# Patient Record
Sex: Male | Born: 1935 | Race: Black or African American | Marital: Married | Smoking: Current every day smoker
Health system: Southern US, Community
[De-identification: ages and names within clinical notes are randomized; demographics above are authoritative.]

## PROBLEM LIST (undated history)

## (undated) DIAGNOSIS — C50919 Malignant neoplasm of unspecified site of unspecified female breast: Secondary | ICD-10-CM

## (undated) DIAGNOSIS — R279 Unspecified lack of coordination: Secondary | ICD-10-CM

## (undated) DIAGNOSIS — M109 Gout, unspecified: Secondary | ICD-10-CM

## (undated) DIAGNOSIS — I639 Cerebral infarction, unspecified: Secondary | ICD-10-CM

## (undated) DIAGNOSIS — R531 Weakness: Secondary | ICD-10-CM

## (undated) DIAGNOSIS — E079 Disorder of thyroid, unspecified: Secondary | ICD-10-CM

## (undated) DIAGNOSIS — I10 Essential (primary) hypertension: Secondary | ICD-10-CM

## (undated) DIAGNOSIS — R5383 Other fatigue: Secondary | ICD-10-CM

## (undated) DIAGNOSIS — H269 Unspecified cataract: Secondary | ICD-10-CM

## (undated) DIAGNOSIS — C61 Malignant neoplasm of prostate: Secondary | ICD-10-CM

## (undated) DIAGNOSIS — M4800 Spinal stenosis, site unspecified: Secondary | ICD-10-CM

## (undated) HISTORY — PX: BREAST LUMPECTOMY: SHX2

## (undated) HISTORY — PX: BACK SURGERY: SHX140

## (undated) HISTORY — PX: X-STOP IMPLANTATION: SHX2677

---

## 2012-03-21 ENCOUNTER — Other Ambulatory Visit (HOSPITAL_COMMUNITY): Payer: Self-pay | Admitting: Internal Medicine

## 2012-03-21 DIAGNOSIS — R4182 Altered mental status, unspecified: Secondary | ICD-10-CM

## 2012-03-22 ENCOUNTER — Emergency Department (HOSPITAL_COMMUNITY)
Admission: EM | Admit: 2012-03-22 | Discharge: 2012-03-22 | Disposition: A | Discharge status: Home / Self Care | Payer: Medicare Other | Attending: Emergency Medicine | Admitting: Emergency Medicine

## 2012-03-22 ENCOUNTER — Other Ambulatory Visit: Payer: Self-pay

## 2012-03-22 ENCOUNTER — Encounter (HOSPITAL_COMMUNITY): Payer: Self-pay

## 2012-03-22 ENCOUNTER — Emergency Department (HOSPITAL_COMMUNITY): Payer: Medicare Other

## 2012-03-22 ENCOUNTER — Ambulatory Visit (HOSPITAL_COMMUNITY)
Admission: RE | Admit: 2012-03-22 | Discharge: 2012-03-22 | Disposition: A | Discharge status: Home / Self Care | Payer: Medicare Other | Source: Ambulatory Visit | Attending: Internal Medicine | Admitting: Internal Medicine

## 2012-03-22 DIAGNOSIS — R4182 Altered mental status, unspecified: Secondary | ICD-10-CM

## 2012-03-22 DIAGNOSIS — W19XXXA Unspecified fall, initial encounter: Secondary | ICD-10-CM

## 2012-03-22 DIAGNOSIS — G319 Degenerative disease of nervous system, unspecified: Secondary | ICD-10-CM | POA: Insufficient documentation

## 2012-03-22 DIAGNOSIS — R55 Syncope and collapse: Secondary | ICD-10-CM

## 2012-03-22 DIAGNOSIS — F29 Unspecified psychosis not due to a substance or known physiological condition: Secondary | ICD-10-CM | POA: Insufficient documentation

## 2012-03-22 DIAGNOSIS — Z79899 Other long term (current) drug therapy: Secondary | ICD-10-CM | POA: Insufficient documentation

## 2012-03-22 DIAGNOSIS — I6789 Other cerebrovascular disease: Secondary | ICD-10-CM | POA: Insufficient documentation

## 2012-03-22 DIAGNOSIS — E079 Disorder of thyroid, unspecified: Secondary | ICD-10-CM | POA: Insufficient documentation

## 2012-03-22 DIAGNOSIS — E119 Type 2 diabetes mellitus without complications: Secondary | ICD-10-CM | POA: Insufficient documentation

## 2012-03-22 DIAGNOSIS — R413 Other amnesia: Secondary | ICD-10-CM | POA: Insufficient documentation

## 2012-03-22 DIAGNOSIS — I10 Essential (primary) hypertension: Secondary | ICD-10-CM | POA: Insufficient documentation

## 2012-03-22 DIAGNOSIS — R5381 Other malaise: Secondary | ICD-10-CM | POA: Insufficient documentation

## 2012-03-22 HISTORY — DX: Unspecified lack of coordination: R27.9

## 2012-03-22 HISTORY — DX: Weakness: R53.1

## 2012-03-22 HISTORY — DX: Spinal stenosis, site unspecified: M48.00

## 2012-03-22 HISTORY — DX: Disorder of thyroid, unspecified: E07.9

## 2012-03-22 HISTORY — DX: Other fatigue: R53.83

## 2012-03-22 HISTORY — DX: Essential (primary) hypertension: I10

## 2012-03-22 HISTORY — DX: Unspecified cataract: H26.9

## 2012-03-22 LAB — COMPREHENSIVE METABOLIC PANEL
ALT: 13 U/L (ref 0–53)
AST: 21 U/L (ref 0–37)
Albumin: 3.6 g/dL (ref 3.5–5.2)
Alkaline Phosphatase: 45 U/L (ref 39–117)
BUN: 15 mg/dL (ref 6–23)
CO2: 30 mEq/L (ref 19–32)
Calcium: 10.7 mg/dL — ABNORMAL HIGH (ref 8.4–10.5)
Chloride: 96 mEq/L (ref 96–112)
Creatinine, Ser: 0.96 mg/dL (ref 0.50–1.35)
GFR calc Af Amer: 88 mL/min — ABNORMAL LOW (ref >90)
GFR calc non Af Amer: 76 mL/min — ABNORMAL LOW (ref >90)
Glucose, Bld: 145 mg/dL — ABNORMAL HIGH (ref 70–99)
Potassium: 3.5 mEq/L (ref 3.5–5.1)
Sodium: 138 mEq/L (ref 135–145)
Total Bilirubin: 0.3 mg/dL (ref 0.3–1.2)
Total Protein: 6.8 g/dL (ref 6.0–8.3)

## 2012-03-22 LAB — CBC WITH DIFFERENTIAL/PLATELET
Basophils Absolute: 0 10*3/uL (ref 0.0–0.1)
Basophils Relative: 0 % (ref 0–1)
Eosinophils Absolute: 0.3 10*3/uL (ref 0.0–0.7)
Eosinophils Relative: 3 % (ref 0–5)
HCT: 32.8 % — ABNORMAL LOW (ref 39.0–52.0)
Hemoglobin: 11.5 g/dL — ABNORMAL LOW (ref 13.0–17.0)
Lymphocytes Relative: 25 % (ref 12–46)
Lymphs Abs: 2.2 10*3/uL (ref 0.7–4.0)
MCH: 32.6 pg (ref 26.0–34.0)
MCHC: 35.1 g/dL (ref 30.0–36.0)
MCV: 92.9 fL (ref 78.0–100.0)
Monocytes Absolute: 0.8 10*3/uL (ref 0.1–1.0)
Monocytes Relative: 9 % (ref 3–12)
Neutro Abs: 5.2 10*3/uL (ref 1.7–7.7)
Neutrophils Relative %: 62 % (ref 43–77)
Platelets: 291 10*3/uL (ref 150–400)
RBC: 3.53 MIL/uL — ABNORMAL LOW (ref 4.22–5.81)
RDW: 15.5 % (ref 11.5–15.5)
WBC: 8.5 10*3/uL (ref 4.0–10.5)

## 2012-03-22 LAB — URINALYSIS, ROUTINE W REFLEX MICROSCOPIC
Bilirubin Urine: NEGATIVE
Glucose, UA: NEGATIVE mg/dL
Hgb urine dipstick: NEGATIVE
Ketones, ur: NEGATIVE mg/dL
Leukocytes, UA: NEGATIVE
Nitrite: NEGATIVE
Protein, ur: NEGATIVE mg/dL
Specific Gravity, Urine: 1.022 (ref 1.005–1.030)
Urobilinogen, UA: 0.2 mg/dL (ref 0.0–1.0)
pH: 5.5 (ref 5.0–8.0)

## 2012-03-22 LAB — POCT I-STAT TROPONIN I: Troponin i, poc: 0.01 ng/mL (ref 0.00–0.08)

## 2012-03-22 MED ORDER — SODIUM CHLORIDE 0.9 % IV BOLUS (SEPSIS)
500.0000 mL | INTRAVENOUS | Status: AC
  Administered 2012-03-22: 1000 mL via INTRAVENOUS

## 2012-03-22 NOTE — ED Notes (Addendum)
Per niece at bedside, pt ambulatory w/walker which is pt's norm, pt reported to niece he didn't feel good, he felt weak and pt grabbed his chest. Niece reports pt didn't "look good" and his speech was slurred. Niece reports pt fell onto his knees then backwards landing on his bottom, niece is unsure if pt hit his head but states pt was lying on his back and head was on the pavement. Niece denies LOC or N/V. Niece reports pt was a/o per his norm. Niece reports pt was here earlier for IR evaluation s/p skeletal surgery, pt is currently a resident at a NH for rehabilitation. Niece reports the front of the back was cobblestone walkway. After leaving the IR evaluation, pt and niece "ran several errands;" went inside the bank, [employer] shack, and [employer].   Pt reports he just does'nt feel like a normal person, pt unable to specify. Pt denies SOB, chest/back/abd pain, N/V/D.

## 2012-03-22 NOTE — ED Notes (Signed)
Family at bedside. 

## 2012-03-22 NOTE — ED Notes (Signed)
The pt was just released from here today after he had a c-t scan here in the ed.  He was going to the atm when his legs gave way and his family member called the ems.  He arrived bu gem alert no pain anywhere .  He reports he was feeling weak and his legs gave way while walking he did not fall.  He has a history of the same.  No pain no other complaints.  Sleeping in  The ems on the way here .

## 2012-03-22 NOTE — ED Provider Notes (Signed)
History     CSN: [id number]  Arrival date & time 03/22/12  1610   First MD Initiated Contact with Patient 03/22/12 1702      Chief Complaint  Patient presents with  . Fatigue    (Consider location/radiation/quality/duration/timing/severity/associated sxs/prior treatment) Patient is a 78 y.o. male presenting with fall. The history is provided by the patient and a relative.  Fall The accident occurred less than 1 hour ago. The fall occurred while walking. Distance fallen: from standing. He landed on concrete. There was no blood loss. Point of impact: knees, then back/head. The pain is at a severity of 0/10. The patient is experiencing no pain. He was not ambulatory at the scene. Pertinent negatives include no fever, no numbness, no abdominal pain, no nausea, no vomiting, no hematuria and no headaches. The symptoms are aggravated by activity. He has tried nothing for the symptoms. The treatment provided significant relief.    Past Medical History  Diagnosis Date  . Hypertension   . Spinal stenosis   . Thyroid disease   . Diabetes mellitus   . Cataract   . Weakness   . Fatigue   . Lack of coordination     Past Surgical History  Procedure Date  . X-stop implantation     Family History  Problem Relation Age of Onset  . Family history unknown: Yes    History  Substance Use Topics  . Smoking status: Never Smoker   . Smokeless tobacco: Not on file  . Alcohol Use: No      Review of Systems  Constitutional: Negative for fever.  HENT: Negative for rhinorrhea, drooling and neck pain.   Eyes: Negative for pain.  Respiratory: Negative for cough and shortness of breath.   Cardiovascular: Negative for chest pain and leg swelling.  Gastrointestinal: Negative for nausea, vomiting, abdominal pain and diarrhea.  Genitourinary: Negative for dysuria and hematuria.  Musculoskeletal: Negative for gait problem.  Skin: Negative for color change.  Neurological: Negative for numbness  and headaches.  Hematological: Negative for adenopathy.  Psychiatric/Behavioral: Negative for behavioral problems.  All other systems reviewed and are negative.    Allergies  Review of patient's allergies indicates no known allergies.  Home Medications   Current Outpatient Rx  Name Route Sig Dispense Refill  . CALCITRIOL 0.25 MCG PO CAPS Oral Take 0.25 mcg by mouth 2 (two) times daily.    Marland Kitchen CALCIUM CARBONATE 600 MG PO TABS Oral Take 1,200 mg by mouth daily.    Marland Kitchen FLUTICASONE PROPIONATE 50 MCG/ACT NA SUSP Nasal Place 2 sprays into the nose daily.    Marland Kitchen HYDROCHLOROTHIAZIDE 25 MG PO TABS Oral Take 25 mg by mouth daily.    Marland Kitchen KETOCONAZOLE 2 % EX SHAM Topical Apply 1 application topically 2 (two) times a week.    Marland Kitchen LATANOPROST 0.005 % OP SOLN Both Eyes Place 1 drop into both eyes at bedtime.    Marland Kitchen LEVOTHYROXINE SODIUM 137 MCG PO TABS Oral Take 137 mcg by mouth daily.    Marland Kitchen MAGNESIUM CHLORIDE 64 MG PO TBEC Oral Take 1 tablet by mouth 2 (two) times daily.    . MELOXICAM 7.5 MG PO TABS Oral Take 7.5 mg by mouth 2 (two) times daily.    Marland Kitchen METFORMIN HCL 500 MG PO TABS Oral Take 500 mg by mouth 2 (two) times daily with a meal.    . ADULT MULTIVITAMIN W/MINERALS CH Oral Take 1 tablet by mouth daily.    . OXYCODONE HCL 5 MG PO  TABS Oral Take 5-10 mg by mouth every 3 (three) hours as needed. For pain    . POTASSIUM CHLORIDE ER 10 MEQ PO CPCR Oral Take 10 mEq by mouth 2 (two) times daily.    Marland Kitchen TAMSULOSIN HCL 0.4 MG PO CAPS Oral Take 0.4 mg by mouth daily.    Marland Kitchen VITAMIN C 500 MG PO TABS Oral Take 500 mg by mouth 2 (two) times daily.      BP 138/62  Pulse 48  Temp 98 F (36.7 C) (Oral)  Resp 19  SpO2 99%  Physical Exam  Nursing note and vitals reviewed. Constitutional: He is oriented to person, place, and time. He appears well-developed and well-nourished.  HENT:  Head: Normocephalic and atraumatic.  Right Ear: External ear normal.  Left Ear: External ear normal.  Nose: Nose normal.    Mouth/Throat: Oropharynx is clear and moist. No oropharyngeal exudate.  Eyes: Conjunctivae and EOM are normal. Pupils are equal, round, and reactive to light.  Neck: Normal range of motion. Neck supple.  Cardiovascular: Normal rate, regular rhythm, normal heart sounds and intact distal pulses.  Exam reveals no gallop and no friction rub.   No murmur heard. Pulmonary/Chest: Effort normal and breath sounds normal. No respiratory distress. He has no wheezes.  Abdominal: Soft. Bowel sounds are normal. He exhibits no distension. There is no tenderness. There is no rebound and no guarding.  Musculoskeletal: Normal range of motion. He exhibits no edema and no tenderness.  Neurological: He is alert and oriented to person, place, and time. He has normal strength. No cranial nerve deficit or sensory deficit. Coordination normal.  Skin: Skin is warm and dry.  Psychiatric: He has a normal mood and affect. His behavior is normal.    ED Course  Procedures (including critical care time)   Labs Reviewed  CBC WITH DIFFERENTIAL  COMPREHENSIVE METABOLIC PANEL  URINALYSIS, ROUTINE W REFLEX MICROSCOPIC  URINE CULTURE   Ct Head Wo Contrast  03/22/2012  *RADIOLOGY REPORT*  Clinical Data: Confusion with weakness and memory loss.  Altered mental status.  CT HEAD WITHOUT CONTRAST  Technique:  Contiguous axial images were obtained from the base of the skull through the vertex without contrast.  Comparison: None.  Findings: Age appropriate atrophy.  Chronic microvascular ischemic changes in the white matter.  No acute infarct.  Negative for hemorrhage or mass.  Calvarium intact.  The visualized paranasal sinuses are clear.  IMPRESSION: Atrophy and chronic microvascular ischemia.  No acute abnormality.   Original Report Authenticated By: Camelia Phenes, M.D.      No diagnosis found.    MDM  5:57 PM 78 y.o. male w hx of dm, spinal stenosis s/p recent back surgery currently in rehab pw fall from standing. Pt seen  here earlier today for outpt CT of head which was non-contrib. Pt states he was not feeling right when walking out of the bank w/ his walker and fell to his knees and back hitting his head. He denies loc, appears well on exam, no motor/sensory abnormalities. He denies syncope, suspect possible presyncopal event. Do not think repeat ct head warranted, will get screening labs.    Screening labs/imaging non-contrib. Pt continues to appear well on exam. Ambulated to bathroom w/ walker. He would like to go home. I have discussed the diagnosis/risks/treatment options with the patient and family and believe the pt to be eligible for discharge home to follow-up with pcp as needed. We also discussed returning to the ED immediately if new  or worsening sx occur. We discussed the sx which are most concerning (e.g., further falls) that necessitate immediate return. Any new prescriptions provided to the patient are listed below.  New Prescriptions   No medications on file    Clinical Impression 1. Fall   2. Pre-syncope        Purvis Sheffield, MD 03/23/12 [phone]

## 2012-03-22 NOTE — ED Notes (Signed)
MD at bedside. (Dr. Harrison) 

## 2012-03-23 LAB — URINE CULTURE
Colony Count: NO GROWTH
Culture: NO GROWTH

## 2012-03-28 NOTE — ED Provider Notes (Signed)
I saw and evaluated the patient, reviewed the resident's note and I agree with the findings and plan.  78 year old male with presyncope. On exam no acute distress. Well-appearing. Heart regular without murmur. Lungs are clear with good air movement. No increased work of breathing. Abdomen is nontender nondistended. Extremities equally. Workup fairly unremarkable. Patient was to be discharged. Feel that this is reasonable at this time. Return outpatient followup  Raeford Razor, MD 03/28/12 0425

## 2014-03-07 ENCOUNTER — Emergency Department (HOSPITAL_COMMUNITY): Payer: Medicare HMO

## 2014-03-07 ENCOUNTER — Inpatient Hospital Stay (HOSPITAL_COMMUNITY)
Admission: EM | Admit: 2014-03-07 | Discharge: 2014-03-17 | DRG: 871 | Disposition: E | Payer: Medicare HMO | Attending: Internal Medicine | Admitting: Internal Medicine

## 2014-03-07 ENCOUNTER — Encounter (HOSPITAL_COMMUNITY): Payer: Self-pay | Admitting: Emergency Medicine

## 2014-03-07 DIAGNOSIS — K120 Recurrent oral aphthae: Secondary | ICD-10-CM | POA: Diagnosis present

## 2014-03-07 DIAGNOSIS — C50919 Malignant neoplasm of unspecified site of unspecified female breast: Secondary | ICD-10-CM | POA: Diagnosis present

## 2014-03-07 DIAGNOSIS — J189 Pneumonia, unspecified organism: Secondary | ICD-10-CM | POA: Diagnosis present

## 2014-03-07 DIAGNOSIS — M109 Gout, unspecified: Secondary | ICD-10-CM | POA: Diagnosis present

## 2014-03-07 DIAGNOSIS — I10 Essential (primary) hypertension: Secondary | ICD-10-CM | POA: Diagnosis present

## 2014-03-07 DIAGNOSIS — E039 Hypothyroidism, unspecified: Secondary | ICD-10-CM | POA: Diagnosis present

## 2014-03-07 DIAGNOSIS — I314 Cardiac tamponade: Secondary | ICD-10-CM | POA: Diagnosis present

## 2014-03-07 DIAGNOSIS — Z853 Personal history of malignant neoplasm of breast: Secondary | ICD-10-CM

## 2014-03-07 DIAGNOSIS — N178 Other acute kidney failure: Secondary | ICD-10-CM

## 2014-03-07 DIAGNOSIS — J852 Abscess of lung without pneumonia: Secondary | ICD-10-CM | POA: Diagnosis present

## 2014-03-07 DIAGNOSIS — I313 Pericardial effusion (noninflammatory): Secondary | ICD-10-CM

## 2014-03-07 DIAGNOSIS — R7989 Other specified abnormal findings of blood chemistry: Secondary | ICD-10-CM

## 2014-03-07 DIAGNOSIS — Z66 Do not resuscitate: Secondary | ICD-10-CM | POA: Diagnosis not present

## 2014-03-07 DIAGNOSIS — M4800 Spinal stenosis, site unspecified: Secondary | ICD-10-CM | POA: Diagnosis present

## 2014-03-07 DIAGNOSIS — E860 Dehydration: Secondary | ICD-10-CM | POA: Diagnosis present

## 2014-03-07 DIAGNOSIS — T68XXXA Hypothermia, initial encounter: Secondary | ICD-10-CM | POA: Diagnosis present

## 2014-03-07 DIAGNOSIS — R0609 Other forms of dyspnea: Secondary | ICD-10-CM

## 2014-03-07 DIAGNOSIS — Z515 Encounter for palliative care: Secondary | ICD-10-CM | POA: Diagnosis not present

## 2014-03-07 DIAGNOSIS — F17200 Nicotine dependence, unspecified, uncomplicated: Secondary | ICD-10-CM | POA: Diagnosis present

## 2014-03-07 DIAGNOSIS — R6510 Systemic inflammatory response syndrome (SIRS) of non-infectious origin without acute organ dysfunction: Secondary | ICD-10-CM

## 2014-03-07 DIAGNOSIS — C3410 Malignant neoplasm of upper lobe, unspecified bronchus or lung: Secondary | ICD-10-CM | POA: Diagnosis present

## 2014-03-07 DIAGNOSIS — I4891 Unspecified atrial fibrillation: Secondary | ICD-10-CM | POA: Diagnosis present

## 2014-03-07 DIAGNOSIS — Z8546 Personal history of malignant neoplasm of prostate: Secondary | ICD-10-CM

## 2014-03-07 DIAGNOSIS — C380 Malignant neoplasm of heart: Secondary | ICD-10-CM

## 2014-03-07 DIAGNOSIS — A419 Sepsis, unspecified organism: Secondary | ICD-10-CM | POA: Diagnosis present

## 2014-03-07 DIAGNOSIS — R6521 Severe sepsis with septic shock: Secondary | ICD-10-CM

## 2014-03-07 DIAGNOSIS — K7200 Acute and subacute hepatic failure without coma: Secondary | ICD-10-CM | POA: Diagnosis present

## 2014-03-07 DIAGNOSIS — J910 Malignant pleural effusion: Secondary | ICD-10-CM

## 2014-03-07 DIAGNOSIS — R6520 Severe sepsis without septic shock: Secondary | ICD-10-CM | POA: Diagnosis present

## 2014-03-07 DIAGNOSIS — N400 Enlarged prostate without lower urinary tract symptoms: Secondary | ICD-10-CM | POA: Diagnosis present

## 2014-03-07 DIAGNOSIS — I3139 Other pericardial effusion (noninflammatory): Secondary | ICD-10-CM | POA: Diagnosis present

## 2014-03-07 DIAGNOSIS — C779 Secondary and unspecified malignant neoplasm of lymph node, unspecified: Secondary | ICD-10-CM

## 2014-03-07 DIAGNOSIS — R627 Adult failure to thrive: Secondary | ICD-10-CM | POA: Diagnosis present

## 2014-03-07 DIAGNOSIS — Z8673 Personal history of transient ischemic attack (TIA), and cerebral infarction without residual deficits: Secondary | ICD-10-CM | POA: Diagnosis not present

## 2014-03-07 DIAGNOSIS — R634 Abnormal weight loss: Secondary | ICD-10-CM | POA: Diagnosis present

## 2014-03-07 DIAGNOSIS — IMO0002 Reserved for concepts with insufficient information to code with codable children: Secondary | ICD-10-CM

## 2014-03-07 DIAGNOSIS — J9600 Acute respiratory failure, unspecified whether with hypoxia or hypercapnia: Secondary | ICD-10-CM | POA: Diagnosis present

## 2014-03-07 DIAGNOSIS — N179 Acute kidney failure, unspecified: Secondary | ICD-10-CM | POA: Diagnosis present

## 2014-03-07 DIAGNOSIS — F1010 Alcohol abuse, uncomplicated: Secondary | ICD-10-CM | POA: Diagnosis present

## 2014-03-07 DIAGNOSIS — N19 Unspecified kidney failure: Secondary | ICD-10-CM

## 2014-03-07 DIAGNOSIS — R570 Cardiogenic shock: Secondary | ICD-10-CM | POA: Diagnosis present

## 2014-03-07 DIAGNOSIS — E119 Type 2 diabetes mellitus without complications: Secondary | ICD-10-CM | POA: Diagnosis present

## 2014-03-07 DIAGNOSIS — C7970 Secondary malignant neoplasm of unspecified adrenal gland: Secondary | ICD-10-CM | POA: Diagnosis present

## 2014-03-07 DIAGNOSIS — R0602 Shortness of breath: Secondary | ICD-10-CM | POA: Diagnosis present

## 2014-03-07 DIAGNOSIS — I318 Other specified diseases of pericardium: Secondary | ICD-10-CM | POA: Diagnosis present

## 2014-03-07 DIAGNOSIS — E43 Unspecified severe protein-calorie malnutrition: Secondary | ICD-10-CM | POA: Diagnosis present

## 2014-03-07 DIAGNOSIS — I5189 Other ill-defined heart diseases: Secondary | ICD-10-CM

## 2014-03-07 DIAGNOSIS — R0989 Other specified symptoms and signs involving the circulatory and respiratory systems: Secondary | ICD-10-CM

## 2014-03-07 HISTORY — DX: Malignant neoplasm of unspecified site of unspecified female breast: C50.919

## 2014-03-07 HISTORY — DX: Malignant neoplasm of prostate: C61

## 2014-03-07 HISTORY — DX: Cerebral infarction, unspecified: I63.9

## 2014-03-07 HISTORY — DX: Gout, unspecified: M10.9

## 2014-03-07 LAB — CBC
HEMATOCRIT: 33.9 % — AB (ref 39.0–52.0)
Hemoglobin: 11.4 g/dL — ABNORMAL LOW (ref 13.0–17.0)
MCH: 29.7 pg (ref 26.0–34.0)
MCHC: 33.6 g/dL (ref 30.0–36.0)
MCV: 88.3 fL (ref 78.0–100.0)
PLATELETS: 477 10*3/uL — AB (ref 150–400)
RBC: 3.84 MIL/uL — AB (ref 4.22–5.81)
RDW: 16.1 % — ABNORMAL HIGH (ref 11.5–15.5)
WBC: 16.2 10*3/uL — AB (ref 4.0–10.5)

## 2014-03-07 LAB — BASIC METABOLIC PANEL
ANION GAP: 26 — AB (ref 5–15)
BUN: 77 mg/dL — AB (ref 6–23)
CHLORIDE: 100 meq/L (ref 96–112)
CO2: 17 meq/L — AB (ref 19–32)
CREATININE: 1.88 mg/dL — AB (ref 0.50–1.35)
Calcium: 6.8 mg/dL — ABNORMAL LOW (ref 8.4–10.5)
GFR calc non Af Amer: 32 mL/min — ABNORMAL LOW (ref >90)
GFR, EST AFRICAN AMERICAN: 37 mL/min — AB (ref >90)
Glucose, Bld: 193 mg/dL — ABNORMAL HIGH (ref 70–99)
POTASSIUM: 5.1 meq/L (ref 3.7–5.3)
Sodium: 143 mEq/L (ref 137–147)

## 2014-03-07 LAB — HEPATIC FUNCTION PANEL
ALBUMIN: 2.5 g/dL — AB (ref 3.5–5.2)
ALT: 528 U/L — ABNORMAL HIGH (ref 0–53)
AST: 617 U/L — ABNORMAL HIGH (ref 0–37)
Alkaline Phosphatase: 250 U/L — ABNORMAL HIGH (ref 39–117)
Bilirubin, Direct: 0.7 mg/dL — ABNORMAL HIGH (ref 0.0–0.3)
Indirect Bilirubin: 0.6 mg/dL (ref 0.3–0.9)
TOTAL PROTEIN: 6.2 g/dL (ref 6.0–8.3)
Total Bilirubin: 1.3 mg/dL — ABNORMAL HIGH (ref 0.3–1.2)

## 2014-03-07 LAB — BLOOD GAS, ARTERIAL
Acid-base deficit: 8.4 mmol/L — ABNORMAL HIGH (ref 0.0–2.0)
Bicarbonate: 15.9 mEq/L — ABNORMAL LOW (ref 20.0–24.0)
DRAWN BY: 41977
O2 CONTENT: 2 L/min
O2 Saturation: 85.9 %
PATIENT TEMPERATURE: 98.6
TCO2: 16.7 mmol/L (ref 0–100)
pCO2 arterial: 28.2 mmHg — ABNORMAL LOW (ref 35.0–45.0)
pH, Arterial: 7.37 (ref 7.350–7.450)
pO2, Arterial: 61.6 mmHg — ABNORMAL LOW (ref 80.0–100.0)

## 2014-03-07 LAB — I-STAT TROPONIN, ED: TROPONIN I, POC: 0.13 ng/mL — AB (ref 0.00–0.08)

## 2014-03-07 LAB — TROPONIN I: Troponin I: <0.3 ng/mL (ref <0.30)

## 2014-03-07 LAB — PRO B NATRIURETIC PEPTIDE: Pro B Natriuretic peptide (BNP): 2829 pg/mL — ABNORMAL HIGH (ref 0–450)

## 2014-03-07 LAB — CBG MONITORING, ED: Glucose-Capillary: 128 mg/dL — ABNORMAL HIGH (ref 70–99)

## 2014-03-07 LAB — TSH: TSH: 64.35 u[IU]/mL — ABNORMAL HIGH (ref 0.350–4.500)

## 2014-03-07 LAB — HIV ANTIBODY (ROUTINE TESTING W REFLEX): HIV: NONREACTIVE

## 2014-03-07 MED ORDER — SODIUM CHLORIDE 0.9 % IV SOLN
INTRAVENOUS | Status: DC
  Administered 2014-03-07 – 2014-03-09 (×4): via INTRAVENOUS

## 2014-03-07 MED ORDER — THIAMINE HCL 100 MG/ML IJ SOLN
100.0000 mg | Freq: Every day | INTRAMUSCULAR | Status: DC
  Filled 2014-03-07 (×3): qty 1

## 2014-03-07 MED ORDER — LATANOPROST 0.005 % OP SOLN
1.0000 [drp] | Freq: Every day | OPHTHALMIC | Status: DC
  Administered 2014-03-07 – 2014-03-09 (×3): 1 [drp] via OPHTHALMIC
  Filled 2014-03-07: qty 2.5

## 2014-03-07 MED ORDER — ALBUTEROL SULFATE (2.5 MG/3ML) 0.083% IN NEBU: 2.5000 mg | INHALATION_SOLUTION | RESPIRATORY_TRACT | Status: DC | PRN

## 2014-03-07 MED ORDER — DEXTROSE 5 % IV SOLN
1.0000 g | Freq: Once | INTRAVENOUS | Status: AC
  Administered 2014-03-07: 1 g via INTRAVENOUS
  Filled 2014-03-07: qty 10

## 2014-03-07 MED ORDER — IPRATROPIUM BROMIDE 0.02 % IN SOLN
0.5000 mg | Freq: Four times a day (QID) | RESPIRATORY_TRACT | Status: DC
  Administered 2014-03-07: 0.5 mg via RESPIRATORY_TRACT
  Filled 2014-03-07: qty 2.5

## 2014-03-07 MED ORDER — VITAMIN B-1 100 MG PO TABS
100.0000 mg | ORAL_TABLET | Freq: Every day | ORAL | Status: DC
  Administered 2014-03-07 – 2014-03-09 (×3): 100 mg via ORAL
  Filled 2014-03-07 (×4): qty 1

## 2014-03-07 MED ORDER — LEVOTHYROXINE SODIUM 137 MCG PO TABS
137.0000 ug | ORAL_TABLET | Freq: Every day | ORAL | Status: DC
  Administered 2014-03-08 – 2014-03-09 (×2): 137 ug via ORAL
  Filled 2014-03-07 (×3): qty 1

## 2014-03-07 MED ORDER — OXYCODONE HCL 5 MG PO TABS
5.0000 mg | ORAL_TABLET | ORAL | Status: DC | PRN
  Filled 2014-03-07 (×8): qty 1

## 2014-03-07 MED ORDER — ASPIRIN EC 81 MG PO TBEC
81.0000 mg | DELAYED_RELEASE_TABLET | Freq: Every day | ORAL | Status: DC
  Administered 2014-03-07 – 2014-03-09 (×3): 81 mg via ORAL
  Filled 2014-03-07 (×4): qty 1

## 2014-03-07 MED ORDER — LORAZEPAM 2 MG/ML IJ SOLN
1.0000 mg | Freq: Four times a day (QID) | INTRAMUSCULAR | Status: DC | PRN
Start: 2014-03-07 — End: 2014-03-10
  Administered 2014-03-09: 1 mg via INTRAVENOUS
  Filled 2014-03-07: qty 1

## 2014-03-07 MED ORDER — HEPARIN SODIUM (PORCINE) 5000 UNIT/ML IJ SOLN
5000.0000 [IU] | Freq: Three times a day (TID) | INTRAMUSCULAR | Status: DC
  Administered 2014-03-07 – 2014-03-10 (×7): 5000 [IU] via SUBCUTANEOUS
  Filled 2014-03-07 (×10): qty 1

## 2014-03-07 MED ORDER — AZITHROMYCIN 250 MG PO TABS
500.0000 mg | ORAL_TABLET | Freq: Once | ORAL | Status: AC
  Administered 2014-03-07: 500 mg via ORAL
  Filled 2014-03-07: qty 2

## 2014-03-07 MED ORDER — ADULT MULTIVITAMIN W/MINERALS CH
1.0000 | ORAL_TABLET | Freq: Every day | ORAL | Status: DC
  Administered 2014-03-07 – 2014-03-09 (×3): 1 via ORAL
  Filled 2014-03-07 (×4): qty 1

## 2014-03-07 MED ORDER — ALBUTEROL SULFATE (2.5 MG/3ML) 0.083% IN NEBU
2.5000 mg | INHALATION_SOLUTION | Freq: Four times a day (QID) | RESPIRATORY_TRACT | Status: DC
  Administered 2014-03-07: 2.5 mg via RESPIRATORY_TRACT
  Filled 2014-03-07: qty 3

## 2014-03-07 MED ORDER — FOLIC ACID 1 MG PO TABS
1.0000 mg | ORAL_TABLET | Freq: Every day | ORAL | Status: DC
  Administered 2014-03-07 – 2014-03-09 (×3): 1 mg via ORAL
  Filled 2014-03-07 (×3): qty 1

## 2014-03-07 MED ORDER — SODIUM CHLORIDE 0.9 % IJ SOLN
3.0000 mL | Freq: Two times a day (BID) | INTRAMUSCULAR | Status: DC
  Administered 2014-03-07 – 2014-03-09 (×4): 3 mL via INTRAVENOUS

## 2014-03-07 MED ORDER — IPRATROPIUM BROMIDE 0.02 % IN SOLN: 0.5000 mg | RESPIRATORY_TRACT | Status: DC | PRN

## 2014-03-07 MED ORDER — LORAZEPAM 1 MG PO TABS: 1.0000 mg | ORAL_TABLET | Freq: Four times a day (QID) | ORAL | Status: DC | PRN

## 2014-03-07 NOTE — ED Notes (Signed)
Informed family that we are waiting for cardiology. Pt resting at this time. No signs of distress.

## 2014-03-07 NOTE — ED Notes (Signed)
Pt switched from [place] to end tidal CO2 [place]. Capnography picking up at 20-21

## 2014-03-07 NOTE — ED Notes (Addendum)
Provider at the bedside. Informed Dr. Audie Pinto that I have been unable to obtain a proper EKG strip. Dr. Audie Pinto at the bedside with [place].

## 2014-03-07 NOTE — H&P (Signed)
Date: 02/15/2014               Patient Name:  Mike Zuniga MRN: [id number]  DOB: 11/22/1929 Age / Sex: 80 y.o., male   PCP: Olevia Bowens, MD         Medical Service: Internal Medicine Teaching Service         Attending Physician: Dr. Karren Cobble, MD    First Contact: Dr. Genene Churn Pager: [phone]  Second Contact: Dr. Hayes Ludwig Pager: [phone]       After Hours (After 5p/  First Contact Pager: [phone]  weekends / holidays): Second Contact Pager: [phone]   Chief Complaint: SOB  History of Present Illness:   80 yo male with smoker, alcohol abuser, hx of remote BCA, PCA, DM, HTN, who presents with fatigue and SOB for last 2 weeks. He lives alone, has been weak, uses wheel chair, not able to do anything on own. Neighbor checked on him and saw he was SOB and called the apartment [employer]ight forward[employer]. He reports some hemoptysis and cough. Also some numbness on arms and legs denies fever/chills.   Hx of breast cancer 60 years ago, had surgery of left breast. Hx of PCA in remission for 4-5 years. Reports 35 lb weight loss in 6 months. CXR shows RUL mass. Recommended to get CT chest. Patient's crt is high. Don't have baseline.   Meds: Current Facility-Administered Medications  Medication Dose Route Frequency Provider Last Rate Last Dose  . 0.9 %  sodium chloride infusion   Intravenous Continuous Blain Pais, MD 125 mL/hr at 02/22/2014 1556    . folic acid (FOLVITE) tablet 1 mg  1 mg Oral Daily Blain Pais, MD      . LORazepam (ATIVAN) tablet 1 mg  1 mg Oral Q6H PRN Blain Pais, MD       Or  . LORazepam (ATIVAN) injection 1 mg  1 mg Intravenous Q6H PRN Blain Pais, MD      . multivitamin with minerals tablet 1 tablet  1 tablet Oral Daily Blain Pais, MD      . thiamine (VITAMIN B-1) tablet 100 mg  100 mg Oral Daily Blain Pais, MD       Or  . thiamine (B-1) injection 100 mg  100 mg Intravenous Daily Blain Pais, MD       Current Outpatient  Prescriptions  Medication Sig Dispense Refill  . calcitRIOL (ROCALTROL) 0.25 MCG capsule Take 0.25 mcg by mouth 2 (two) times daily.      . calcium carbonate (OS-CAL) 600 MG TABS Take 1,200 mg by mouth daily.      . fluticasone (FLONASE) 50 MCG/ACT nasal spray Place 2 sprays into the nose daily.      . hydrochlorothiazide (HYDRODIURIL) 25 MG tablet Take 25 mg by mouth daily.      Marland Kitchen ketoconazole (NIZORAL) 2 % shampoo Apply 1 application topically 2 (two) times a week.      . latanoprost (XALATAN) 0.005 % ophthalmic solution Place 1 drop into both eyes at bedtime.      Marland Kitchen levothyroxine (SYNTHROID, LEVOTHROID) 137 MCG tablet Take 137 mcg by mouth daily.      . magnesium chloride (SLOW-MAG) 64 MG TBEC Take 1 tablet by mouth 2 (two) times daily.      . meloxicam (MOBIC) 7.5 MG tablet Take 7.5 mg by mouth 2 (two) times daily.      . metFORMIN (GLUCOPHAGE) 500 MG tablet Take 500 mg by mouth  2 (two) times daily with a meal.      . Multiple Vitamin (MULTIVITAMIN WITH MINERALS) TABS Take 1 tablet by mouth daily.      Marland Kitchen oxyCODONE (ROXICODONE) 5 MG immediate release tablet Take 5-10 mg by mouth every 3 (three) hours as needed. For pain      . potassium chloride (MICRO-K) 10 MEQ CR capsule Take 10 mEq by mouth 2 (two) times daily.      . Tamsulosin HCl (FLOMAX) 0.4 MG CAPS Take 0.4 mg by mouth daily.      . vitamin C (ASCORBIC ACID) 500 MG tablet Take 500 mg by mouth 2 (two) times daily.        Allergies: Allergies as of 03/15/2014  . (No Known Allergies)   Past Medical History  Diagnosis Date  . Hypertension   . Spinal stenosis   . Thyroid disease   . Diabetes mellitus   . Cataract   . Weakness   . Fatigue   . Lack of coordination   . Stroke   . Gout   . Prostate cancer     followed at [facility]  . Breast cancer     reports cancer with L breast excision 60 years ago, he has no further details   Past Surgical History  Procedure Laterality Date  . X-stop implantation    . Back surgery    .  Breast lumpectomy Left     excision of cancer 60 years ago, patient has no details   History reviewed. No pertinent family history. History   Social History  . Marital Status: Married    Spouse Name: N/A    Number of Children: N/A  . Years of Education: N/A   Occupational History  . Not on file.   Social History Main Topics  . Smoking status: Current Every Day Smoker    Types: Cigarettes  . Smokeless tobacco: Not on file  . Alcohol Use: Yes     Comment: a fifth will last 3 days  . Drug Use: No  . Sexual Activity: No   Other Topics Concern  . Not on file   Social History Narrative   Lives alone in [place]          Review of Systems: Review of Systems  Constitutional: Negative for fever and chills.  HENT: Negative.   Eyes: Negative.   Respiratory: Positive for cough, hemoptysis, sputum production and shortness of breath. Negative for wheezing.   Cardiovascular: Negative.   Gastrointestinal: Negative for heartburn, nausea, vomiting, abdominal pain, diarrhea, constipation, blood in stool and melena.  Genitourinary: Negative.  Negative for dysuria and urgency.  Musculoskeletal: Negative.   Skin: Negative.   Neurological: Negative.   Endo/Heme/Allergies: Negative.   Psychiatric/Behavioral: Negative.      Physical Exam: Blood pressure 102/57, pulse 80, temperature 97.5 F (36.4 C), temperature source Oral, resp. rate 20, height 5\' 10"  (1.778 m), weight 62.596 kg (138 lb), SpO2 95.00%. Physical Exam  Constitutional: He is oriented to person, place, and time. He appears well-developed and well-nourished. He appears distressed.  HENT:  Head: Normocephalic.  Right Ear: External ear normal.  Left Ear: External ear normal.  Nose: Nose normal.  Mouth/Throat: Oropharynx is clear and moist.  Eyes: Conjunctivae and EOM are normal. Pupils are equal, round, and reactive to light. Right eye exhibits no discharge. Left eye exhibits no discharge. No scleral icterus.    Neck: Normal range of motion. Neck supple. No JVD present. Carotid bruit is not present.  No mass and no thyromegaly present.  Cardiovascular: Normal rate, S1 normal and S2 normal.  An irregular rhythm present. Exam reveals no friction rub.   No murmur heard. Respiratory: Effort normal and breath sounds normal.  GI: Soft. Bowel sounds are normal. He exhibits no distension and no mass. There is no tenderness. There is no rebound and no guarding.    Surgical scar noted on left nipple from previous breast cancer.  Musculoskeletal: Normal range of motion.  Lymphadenopathy:    He has no cervical adenopathy.  Neurological: He is alert and oriented to person, place, and time. He has normal reflexes. He displays no tremor. No cranial nerve deficit. He displays no seizure activity. Coordination normal. GCS eye subscore is 4. GCS verbal subscore is 5. GCS motor subscore is 6.  Skin: He is not diaphoretic.  Psychiatric: He has a normal mood and affect. His speech is normal and behavior is normal.   Lab results: Basic Metabolic Panel:  Recent Labs  03/13/2014 1251  NA 143  K 5.1  CL 100  CO2 17*  GLUCOSE 193*  BUN 77*  CREATININE 1.88*  CALCIUM 6.8*   Liver Function Tests: No results found for this basename: AST, ALT, ALKPHOS, BILITOT, PROT, ALBUMIN,  in the last 72 hours No results found for this basename: LIPASE, AMYLASE,  in the last 72 hours No results found for this basename: AMMONIA,  in the last 72 hours CBC:  Recent Labs  03/05/2014 1251  WBC 16.2*  HGB 11.4*  HCT 33.9*  MCV 88.3  PLT 477*   Cardiac Enzymes:  Recent Labs  03/06/2014 1251  TROPONINI <0.30   BNP:  Recent Labs  03/03/2014 1251  PROBNP 2829.0*   D-Dimer: No results found for this basename: DDIMER,  in the last 72 hours CBG:  Recent Labs  03/09/2014 [place]. Labs: ECHO 02/25/2014 Left ventricle: The cavity size was normal. Wall thickness was increased in a pattern of mild LVH.  Systolic function was normal. The estimated ejection fraction was in the range of 55% to 60%. - Right ventricle: Marked thickening of the free wall with likely infiltrating mass. Near RVOT there is a large encapsulated mass measuring 3.0 x 2.5 cm on short axis views. - Atrial septum: No defect or patent foramen ovale was identified. - Pericardium, extracardiac: Large pericardial effusion with pre tamponade physiology although IVC seems to collapse Contstritive evaluation poor due to rapid arrhythimia  Imaging results:  Dg Chest 2 View  02/15/2014   CLINICAL DATA:  One-week history of weakness and shortness of breath with decreased appetite ; history of diabetes  EXAM: CHEST  2 VIEW  COMPARISON:  Portable chest x-ray of March 22, 2012  FINDINGS: There is new abnormal increased interstitial density posteriorly in the right upper lobe. Elsewhere the right lung is clear as is the left lung. There is no pleural effusion. The mediastinum is not shifted. The heart is top-normal in size. The pulmonary vascularity is not engorged. The bony thorax is unremarkable.  IMPRESSION: New increased density in the right upper lobe posteriorly is most compatible with pneumonia. Follow-up chest film films following therapy are recommended to assure clearing. Chest CT scanning may be ultimately indicated if the findings/ clinical symptoms are unresponsive to therapy.   Electronically Signed   By: David  Martinique   On: 03/15/2014 13:38   Ct Chest Wo Contrast  03/12/2014   CLINICAL DATA:  Shortness of breath.  EXAM: CT CHEST WITHOUT  CONTRAST  TECHNIQUE: Multidetector CT imaging of the chest was performed following the standard protocol without IV contrast.  COMPARISON:  Chest radiograph of same day.  FINDINGS: No pneumothorax or significant pleural effusion is noted. 8 mm nodule is noted in lingula best seen on image number 34 series 3. Large airspace opacity is noted posteriorly in the right upper lobe most consistent with  pneumonia. There appears to be some degree of necrosis present within this suggesting developing abscess. 21 x 17 mm rounded density is seen inferior to this which may represent focal inflammation, but is concerning for possible neoplasm giving its well-defined appearance. Mild airspace opacity is seen medially in the right lower lobe consistent with focal pneumonia.  No significant osseous abnormality is noted. Moderate pericardial effusion is noted. Atherosclerotic calcifications of thoracic aorta are noted without aneurysm formation. 31 x 22 mm left adrenal mass is noted.  IMPRESSION: Large right upper lobe pneumonia is noted, with air present within it suggesting parenchymal necrosis and possible developing abscess. Smaller airspace opacity is seen medially in the right lower lobe also concerning for pneumonia.  21 x 17 mm rounded density seen more inferiorly in the right upper lobe which is concerning for possible neoplasm or malignancy. Followup CT is recommended in 2-3 weeks to see if this represents inflammation; if it is persistent, further evaluation with PET scan or biopsy is recommended.  3.1 cm left adrenal mass is noted. Further evaluation with MRI is recommended to evaluate for possible neoplasm.  Moderate pericardial effusion is noted.  8 mm left lingular nodule is noted. If the abnormality in the right upper lobe is determined to be malignant, this would be consistent with metastatic disease. Otherwise, if the patient is at high risk for bronchogenic carcinoma, follow-up chest CT at 3-76months is recommended. If the patient is at low risk for bronchogenic carcinoma, follow-up chest CT at 6-12 months is recommended. This recommendation follows the consensus statement: Guidelines for Management of Small Pulmonary Nodules Detected on CT Scans: A Statement from the [employer] as published in Radiology 2005; 237:395-400.   Electronically Signed   By: Sabino Dick M.D.   On: 03/13/2014 16:11     Other results: EKG: aflutter, rate 140's, has electrical alternans.   Assessment & Plan by Problem: Principal Problem:   Sepsis Active Problems:   Postobstructive pneumonia   DM2 (diabetes mellitus, type 2)   Spinal stenosis, unspecified region other than cervical   HTN (hypertension)   Hypothyroid   Pericardial effusion   BPH (benign prostatic hyperplasia)   History of breast cancer  80 yo male with smoker, alcohol abuser, hx of remote BCA, PCA, DM, HTN, who presents with fatigue and SOB for last 2 weeks, found to have PNA and lung nodules on CXR and chest CT, and pleural effusion+RV mass on echo.   Large pericardial effusion with RV mass - likely malignant given hx of PCA, BCA, nodules to lungs. cardiology on board. Discussing with CT surgeon for pericardial window. Getting sample from pericardial window will be ideal for malignancy workup. Bronch will not be high output with the location of the lung nodules. There is no urgent indication for pericardial drain per cardiology as there is no tamponade currently.  - f/up CT surgery recs and card recs - will aggressively volume resuscitate. If decompensates, immediately call Dr. Eppie Gibson, give her bolus, emergent pericardiocentesis.  - plan cardiac MRI on Monday. - will check PSA. To r/o mets from PCA as source   Severe  Sepsis with HCAP - with hypothermia, leukocytosis, and low BP,and AKI. based on recent hospitalization July, likely post-obstructive PNA. SOB with tachycardia in the setting of pulmonary nodules. cxr shows new RUL posterior density. CT chest shows multiple nodules. large posterior RUL PNA, with air suggesting parenchymal necrosis and possible abscess.  also 21x17 rounded density possibly neoplasm. WBC high.   This is likely post obstructive PNA from neoplasm. Malignancy is highly probable given hx of BCA and PCA.  - treat with vanc+zosyn. BCX's. - repeat CBC, CMP.   Acute renal failure- pre-renal 2/2 to above.  Baseline around 0.96. - cont IVF.   Alcohol abuse - last drink 8/19. Will have on CIWA protocol.  - thiamine, folate, multivitamins  DM II -hold home metformin.   Hypothyroidism -cont synthyroid   PPx: heparin Diet: carb mod Code: full. Confirmed with patient.      Dispo: Disposition is deferred at this time, awaiting improvement of current medical problems. Anticipated discharge in approximately 2-3 day(s).   The patient does have a current PCP Olevia Bowens, MD) and does need an [facility] hospital follow-up appointment after discharge.  The patient does not know have transportation limitations that hinder transportation to clinic appointments.  Signed: Dellia Nims, MD 02/17/2014, 5:15 PM

## 2014-03-07 NOTE — ED Notes (Signed)
Lab at the bedside 

## 2014-03-07 NOTE — ED Notes (Signed)
Pt returned from CT °

## 2014-03-07 NOTE — ED Provider Notes (Signed)
CSN: [id number]     Arrival date & time 03/03/2014  1147 History   First MD Initiated Contact with Patient 03/15/2014 1233     Chief Complaint  Patient presents with  . Shortness of Breath      HPI Pt states hes felt increasingly SOB and weak this week. His daughter brought him today because "he could hardly get out of the bed." pt reports poor oral intake thiks week, he states "i just havent felt like eating." he denies any pain. He is alert, breathing mildly labored while at rest in triage but he is able to speak in short sentences.  Has had 35 lb. Weight loss in past 6 months.  Past Medical History  Diagnosis Date  . Hypertension   . Spinal stenosis   . Thyroid disease   . Diabetes mellitus   . Cataract   . Weakness   . Fatigue   . Lack of coordination   . Stroke   . Gout   . Prostate cancer     followed at [facility]  . Breast cancer     reports cancer with L breast excision 60 years ago, he has no further details   Past Surgical History  Procedure Laterality Date  . X-stop implantation    . Back surgery    . Breast lumpectomy Left     excision of cancer 60 years ago, patient has no details   History reviewed. No pertinent family history. History  Substance Use Topics  . Smoking status: Current Every Day Smoker    Types: Cigarettes  . Smokeless tobacco: Not on file  . Alcohol Use: Yes     Comment: a fifth will last 3 days    Review of Systems  All other systems reviewed and are negative.     Allergies  Review of patient's allergies indicates no known allergies.  Home Medications   Prior to Admission medications   Medication Sig Start Date End Date Taking? Authorizing Provider  allopurinol (ZYLOPRIM) 100 MG tablet Take 100 mg by mouth daily.   Yes Historical Provider, MD  aspirin EC 81 MG tablet Take 81 mg by mouth daily.   Yes Historical Provider, MD  atorvastatin (LIPITOR) 10 MG tablet Take 10 mg by mouth daily.   Yes Historical Provider, MD  calcitRIOL  (ROCALTROL) 0.25 MCG capsule Take 0.25 mcg by mouth 2 (two) times daily.   Yes Historical Provider, MD  indomethacin (INDOCIN) 25 MG capsule Take 25 mg by mouth 2 (two) times daily with a meal.   Yes Historical Provider, MD  levothyroxine (SYNTHROID, LEVOTHROID) 175 MCG tablet Take 175 mcg by mouth daily before breakfast.   Yes Historical Provider, MD  lisinopril (PRINIVIL,ZESTRIL) 20 MG tablet Take 20 mg by mouth daily.   Yes Historical Provider, MD  metFORMIN (GLUCOPHAGE) 500 MG tablet Take 500 mg by mouth 2 (two) times daily with a meal.   Yes Historical Provider, MD  oxybutynin (DITROPAN-XL) 10 MG 24 hr tablet Take 10 mg by mouth at bedtime.   Yes Historical Provider, MD  tadalafil (CIALIS) 10 MG tablet Take 10 mg by mouth daily as needed for erectile dysfunction.   Yes Historical Provider, MD   BP 85/61  Pulse 60  Temp(Src) 97.5 F (36.4 C) (Oral)  Resp 23  Ht 5\' 10"  (1.778 m)  Wt 139 lb 5.3 oz (63.2 kg)  BMI 19.99 kg/m2  SpO2 93% Physical Exam  Nursing note and vitals reviewed. Constitutional: He is oriented to person,  place, and time. He appears well-developed and well-nourished. No distress.  HENT:  Head: Normocephalic and atraumatic.  Eyes: Pupils are equal, round, and reactive to light.  Neck: Normal range of motion.  Cardiovascular: Intact distal pulses.   Heart tones distant.  Bedside echo revealed pericardial effusion with no tamponade.  Irregular rhythm?  Pulmonary/Chest: No respiratory distress.  Abdominal: Normal appearance. He exhibits no distension.  Musculoskeletal: Normal range of motion.  Neurological: He is alert and oriented to person, place, and time. No cranial nerve deficit.  Skin: Skin is warm and dry. No rash noted.  Psychiatric: He has a normal mood and affect. His behavior is normal.    ED Course  Procedures (including critical care time)  CRITICAL CARE Performed by: Leonard Schwartz L Total critical care time: 45 min Critical care time was exclusive  of separately billable procedures and treating other patients. Critical care was necessary to treat or prevent imminent or life-threatening deterioration. Critical care was time spent personally by me on the following activities: development of treatment plan with patient and/or surrogate as well as nursing, discussions with consultants, evaluation of patient's response to treatment, examination of patient, obtaining history from patient or surrogate, ordering and performing treatments and interventions, ordering and review of laboratory studies, ordering and review of radiographic studies, pulse oximetry and re-evaluation of patient's condition.  Cardiology consulted and saw patient in ED.  Will be admitted to teaching service. Labs Review Labs Reviewed  CBC - Abnormal; Notable for the following:    WBC 16.2 (*)    RBC 3.84 (*)    Hemoglobin 11.4 (*)    HCT 33.9 (*)    RDW 16.1 (*)    Platelets 477 (*)    All other components within normal limits  BASIC METABOLIC PANEL - Abnormal; Notable for the following:    CO2 17 (*)    Glucose, Bld 193 (*)    BUN 77 (*)    Creatinine, Ser 1.88 (*)    Calcium 6.8 (*)    GFR calc non Af Amer 32 (*)    GFR calc Af Amer 37 (*)    Anion gap 26 (*)    All other components within normal limits  PRO B NATRIURETIC PEPTIDE - Abnormal; Notable for the following:    Pro B Natriuretic peptide (BNP) 2829.0 (*)    All other components within normal limits  TSH - Abnormal; Notable for the following:    TSH 64.350 (*)    All other components within normal limits  BASIC METABOLIC PANEL - Abnormal; Notable for the following:    CO2 16 (*)    Glucose, Bld 173 (*)    BUN 79 (*)    Creatinine, Ser 2.26 (*)    Calcium 6.5 (*)    GFR calc non Af Amer 25 (*)    GFR calc Af Amer 29 (*)    Anion gap 25 (*)    All other components within normal limits  CBC - Abnormal; Notable for the following:    WBC 20.5 (*)    RBC 3.76 (*)    Hemoglobin 11.0 (*)    HCT 33.4  (*)    RDW 16.2 (*)    Platelets 437 (*)    All other components within normal limits  HEPATIC FUNCTION PANEL - Abnormal; Notable for the following:    Albumin 2.5 (*)    AST 617 (*)    ALT 528 (*)    Alkaline Phosphatase 250 (*)  Total Bilirubin 1.3 (*)    Bilirubin, Direct 0.7 (*)    All other components within normal limits  BLOOD GAS, ARTERIAL - Abnormal; Notable for the following:    pCO2 arterial 28.2 (*)    pO2, Arterial 61.6 (*)    Bicarbonate 15.9 (*)    Acid-base deficit 8.4 (*)    All other components within normal limits  I-STAT TROPOININ, ED - Abnormal; Notable for the following:    Troponin i, poc 0.13 (*)    All other components within normal limits  CBG MONITORING, ED - Abnormal; Notable for the following:    Glucose-Capillary 128 (*)    All other components within normal limits  CULTURE, BLOOD (ROUTINE X 2)  CULTURE, BLOOD (ROUTINE X 2)  CULTURE, EXPECTORATED SPUTUM-ASSESSMENT  GRAM STAIN  TROPONIN I  TROPONIN I  TROPONIN I  HIV ANTIBODY (ROUTINE TESTING)  MAGNESIUM  LEGIONELLA ANTIGEN, URINE  STREP PNEUMONIAE URINARY ANTIGEN  URINALYSIS, ROUTINE W REFLEX MICROSCOPIC  SODIUM, URINE, RANDOM  CREATININE, URINE, RANDOM  HEMOGLOBIN A1C  CYTOLOGY - NON PAP    Imaging Review Dg Chest 2 View  02/17/2014   CLINICAL DATA:  One-week history of weakness and shortness of breath with decreased appetite ; history of diabetes  EXAM: CHEST  2 VIEW  COMPARISON:  Portable chest x-ray of March 22, 2012  FINDINGS: There is new abnormal increased interstitial density posteriorly in the right upper lobe. Elsewhere the right lung is clear as is the left lung. There is no pleural effusion. The mediastinum is not shifted. The heart is top-normal in size. The pulmonary vascularity is not engorged. The bony thorax is unremarkable.  IMPRESSION: New increased density in the right upper lobe posteriorly is most compatible with pneumonia. Follow-up chest film films following  therapy are recommended to assure clearing. Chest CT scanning may be ultimately indicated if the findings/ clinical symptoms are unresponsive to therapy.   Electronically Signed   By: David  Martinique   On: 02/20/2014 13:38       EKG Interpretation   Date/Time:  Saturday March 07 2014 11:57:34 EDT Ventricular Rate:  92 PR Interval:  130 QRS Duration: 54 QT Interval:  410 QTC Calculation: 507 R Axis:   50 Text Interpretation:  Undetermined rhythm Low voltage QRS Cannot rule out  Anteroseptal infarct , age undetermined T wave abnormality, consider  lateral ischemia Prolonged QT Abnormal ECG Baseline wander Confirmed by  Suhailah Kwan  MD, Alphonsine Minium ([id number]) on 02/28/2014 1:45:26 PM      MDM   Final diagnoses:  Pericardial effusion  Mass of right cardiac ventricle  Acute prerenal azotemia  Acute renal failure with other specified pathological lesion in kidney  Dehydration  Cardiac malignancy  Pericardial effusion with cardiac tamponade  Postobstructive pneumonia  SIRS (systemic inflammatory response syndrome)        Dot Lanes, MD 02/21/2014 1201

## 2014-03-07 NOTE — ED Notes (Signed)
Attempted Report 

## 2014-03-07 NOTE — ED Notes (Signed)
NOTIFIED DR. BEATON FOR PATIENTS PANIC LAB RESULTS OF I-STAT TNL OF 0.13 ng/ml ,@13 :15 PM ,03/03/2014.

## 2014-03-07 NOTE — Consult Note (Addendum)
Primary Care Physician: Olevia Bowens, MD Referring Physician:   Montrae Zuniga is a 80 y.o. male with a h/o remote breast cancer and surgery, prostate CA, diabetes and hypertension who presents for failure to thrive.  His family reports that over the past few months that he has declined significantly.  His mentation has deteriorated (though family does not recall any diagnosis of dementia.)  He has most noticeably had functional decline.  He has lost weight.  He cannot presently get himself out of bed due to weakness.  He has occasional diaphoresis.  Today, he denies symptoms of palpitations, chest pain, shortness of breath, lower extremity edema, dizziness, presyncope, syncope, or neurologic sequela. The patient is otherwise without complaint today.   Past Medical History  Diagnosis Date  . Hypertension   . Spinal stenosis   . Thyroid disease   . Diabetes mellitus   . Cataract   . Weakness   . Fatigue   . Lack of coordination   . Stroke   . Gout   . Prostate cancer     followed at [facility]  . Breast cancer     reports cancer with L breast excision 60 years ago, he has no further details   Past Surgical History  Procedure Laterality Date  . X-stop implantation    . Back surgery    . Breast lumpectomy Left     excision of cancer 60 years ago, patient has no details    Current Facility-Administered Medications  Medication Dose Route Frequency Provider Last Rate Last Dose  . azithromycin (ZITHROMAX) tablet 500 mg  500 mg Oral Once Dot Lanes, MD       Current Outpatient Prescriptions  Medication Sig Dispense Refill  . calcitRIOL (ROCALTROL) 0.25 MCG capsule Take 0.25 mcg by mouth 2 (two) times daily.      . calcium carbonate (OS-CAL) 600 MG TABS Take 1,200 mg by mouth daily.      . fluticasone (FLONASE) 50 MCG/ACT nasal spray Place 2 sprays into the nose daily.      . hydrochlorothiazide (HYDRODIURIL) 25 MG tablet Take 25 mg by mouth daily.      Marland Kitchen ketoconazole (NIZORAL)  2 % shampoo Apply 1 application topically 2 (two) times a week.      . latanoprost (XALATAN) 0.005 % ophthalmic solution Place 1 drop into both eyes at bedtime.      Marland Kitchen levothyroxine (SYNTHROID, LEVOTHROID) 137 MCG tablet Take 137 mcg by mouth daily.      . magnesium chloride (SLOW-MAG) 64 MG TBEC Take 1 tablet by mouth 2 (two) times daily.      . meloxicam (MOBIC) 7.5 MG tablet Take 7.5 mg by mouth 2 (two) times daily.      . metFORMIN (GLUCOPHAGE) 500 MG tablet Take 500 mg by mouth 2 (two) times daily with a meal.      . Multiple Vitamin (MULTIVITAMIN WITH MINERALS) TABS Take 1 tablet by mouth daily.      Marland Kitchen oxyCODONE (ROXICODONE) 5 MG immediate release tablet Take 5-10 mg by mouth every 3 (three) hours as needed. For pain      . potassium chloride (MICRO-K) 10 MEQ CR capsule Take 10 mEq by mouth 2 (two) times daily.      . Tamsulosin HCl (FLOMAX) 0.4 MG CAPS Take 0.4 mg by mouth daily.      . vitamin C (ASCORBIC ACID) 500 MG tablet Take 500 mg by mouth 2 (two) times daily.  No Known Allergies  History   Social History  . Marital Status: Married    Spouse Name: N/A    Number of Children: N/A  . Years of Education: N/A   Occupational History  . Not on file.   Social History Main Topics  . Smoking status: Current Every Day Smoker    Types: Cigarettes  . Smokeless tobacco: Not on file  . Alcohol Use: Yes     Comment: a fifth will last 3 days  . Drug Use: No  . Sexual Activity: No   Other Topics Concern  . Not on file   Social History Narrative   Lives alone in [place]          [place]- pt and daughters are unaware of FH of cancer, DM, HTN, or other  ROS- All systems are reviewed and negative except as per the HPI above  Physical Exam: Filed Vitals:   02/16/2014 1200 02/23/2014 1253 02/26/2014 1421  BP: 88/50 106/55 124/68  Pulse:  58 60  Temp:  95.3 F (35.2 C)   TempSrc: Oral Rectal   Resp:  18 20  SpO2:  96% 95%    GEN- The patient is ill appearing,  sleeping but rouses, diaphoretic Head- normocephalic, atraumatic Eyes-  Sclera clear, conjunctiva pink Ears- hearing intact Oropharynx- clear Neck- supple  Lungs- Clear to ausculation bilaterally, normal work of breathing Heart- distant heart sounds, regular rate and rhythm with frequent ectopy, no m/r/g GI- soft, NT, ND, + BS Extremities- no clubbing, cyanosis, or edema MS- diffuse muscle atrophy Skin- well healed surgical incision over his L breast area Psych- flat affect Neuro- appears weak but moves all extremities  EKG- sinus rhythm with very low voltage, diffuse ST changes Echo reveals large pericardial effusion without tamponade, there is a very large mass associated with the right ventricle cxr is reviewed  Assessment and Plan:  1. Pericardial effusion without tamponade, associated right ventricular mass The patient presents with failure to thrive and has become progressively ill over several months.  I suspect that he has a malignant effusion and certainly his RV mass is worrisome.  He says that he had breast cancer years ago and also has prostate cancer.  We do not have records from [facility].  The Internal Medicine teaching service will assist with obtaining records.  There is no urgent indication for pericardial drain, though I suspect that he may benefit from pericardial window in the next few days.  I primary cardiac sarcoma or other lesion may also be possible.  An infiltrative process is less likely. I have recommended admission to the Medicine Teaching service and placement in a stepdown unit. IVF with NS to maintain preload and blood pressure. Will plan cardiac MRI on Monday.  2. Acute renal failure Likely due to prerenal azotemia/ dehydration Would recommend aggressive IVF with NS as tolerated and follow creatinine closely  I had a long discussion with patient and his family about the above.  I am very concerned that his prognosis is poor.  Ultimately palliative  measures may be required.  For now, patient would like to continue with full code and usual medical measures.  He and his family will continue to have discussions about his code status/ treatment as more information about his illness is known.

## 2014-03-07 NOTE — Progress Notes (Signed)
Echo Lab  2D Echocardiogram completed.  [place], RDCS 03/04/2014 3:01 PM

## 2014-03-07 NOTE — Consult Note (Signed)
[address]       ,[place] [address]             [phone]      Cardiothoracic Surgery Consultation  Reason for Consult: Large pericardial effusion and cardiac masses Referring Physician: Thompson Grayer, MD  Beacher Every is an 80 y.o. male.  HPI:   The patient is an 80 year old gentleman with hypertension, diabetes, remote breast cancer excision many years ago, and prostate cancer followed at [facility] who presents with a several month history of failure to thrive with poor appetite, weight loss, altered mental status and generalized weakness over the past two weeks to the point that he can't get out of bed himself or walk. An echo today showed a large pericardial effusion without tamponade with thickening of the RV free wall with a well defined mass. He had a CT scan of the chest that showed extensive pneumonia in the RUL of the lung with extensive necrosis suggesting possible lung abscess. There is also a 21 x 17 mm rounded density in the RUL. There is also a 3 cm left adrenal mass. He is dehydrated with an elevated BUN and creat.   Past Medical History  Diagnosis Date  . Hypertension   . Spinal stenosis   . Thyroid disease   . Diabetes mellitus   . Cataract   . Weakness   . Fatigue   . Lack of coordination   . Stroke   . Gout   . Prostate cancer     followed at [facility]  . Breast cancer     reports cancer with L breast excision 60 years ago, he has no further details    Past Surgical History  Procedure Laterality Date  . X-stop implantation    . Back surgery    . Breast lumpectomy Left     excision of cancer 60 years ago, patient has no details    History reviewed. No pertinent family history.  Social History:  reports that he has been smoking Cigarettes.  He has been smoking about 0.00 packs per day. He does not have any smokeless tobacco history on file. He reports that he drinks alcohol. He reports that he does not use illicit  drugs.  Allergies: No Known Allergies  Medications:  I have reviewed the patient's current medications. Prior to Admission:  Prescriptions prior to admission  Medication Sig Dispense Refill  . allopurinol (ZYLOPRIM) 100 MG tablet Take 100 mg by mouth daily.      Marland Kitchen aspirin EC 81 MG tablet Take 81 mg by mouth daily.      Marland Kitchen atorvastatin (LIPITOR) 10 MG tablet Take 10 mg by mouth daily.      . calcitRIOL (ROCALTROL) 0.25 MCG capsule Take 0.25 mcg by mouth 2 (two) times daily.      . indomethacin (INDOCIN) 25 MG capsule Take 25 mg by mouth 2 (two) times daily with a meal.      . levothyroxine (SYNTHROID, LEVOTHROID) 175 MCG tablet Take 175 mcg by mouth daily before breakfast.      . lisinopril (PRINIVIL,ZESTRIL) 20 MG tablet Take 20 mg by mouth daily.      . metFORMIN (GLUCOPHAGE) 500 MG tablet Take 500 mg by mouth 2 (two) times daily with a meal.      . oxybutynin (DITROPAN-XL) 10 MG 24 hr tablet Take 10 mg by mouth at bedtime.      . tadalafil (CIALIS) 10 MG tablet  Take 10 mg by mouth daily as needed for erectile dysfunction.       Scheduled: . aspirin EC  81 mg Oral Daily  . folic acid  1 mg Oral Daily  . heparin  5,000 Units Subcutaneous 3 times per day  . latanoprost  1 drop Both Eyes QHS  . [START ON 02/15/2014] levothyroxine  137 mcg Oral QAC breakfast  . multivitamin with minerals  1 tablet Oral Daily  . sodium chloride  3 mL Intravenous Q12H  . thiamine  100 mg Oral Daily   Or  . thiamine  100 mg Intravenous Daily   Continuous: . sodium chloride 125 mL/hr at 03/12/2014 1900   QPR:[id number], ipratropium, LORazepam, LORazepam, oxyCODONE  Results for orders placed during the hospital encounter of 02/26/2014 (from the past 48 hour(s))  CBG MONITORING, ED     Status: Abnormal   Collection Time    02/15/2014 12:17 PM      Result Value Ref Range   Glucose-Capillary 128 (*) 70 - 99 mg/dL  CBC     Status: Abnormal   Collection Time    03/01/2014 12:51 PM      Result Value Ref Range    WBC 16.2 (*) 4.0 - 10.5 K/uL   RBC 3.84 (*) 4.22 - 5.81 MIL/uL   Hemoglobin 11.4 (*) 13.0 - 17.0 g/dL   HCT 33.9 (*) 39.0 - 52.0 %   MCV 88.3  78.0 - 100.0 fL   MCH 29.7  26.0 - 34.0 pg   MCHC 33.6  30.0 - 36.0 g/dL   RDW 16.1 (*) 11.5 - 15.5 %   Platelets 477 (*) 150 - 400 K/uL  BASIC METABOLIC PANEL     Status: Abnormal   Collection Time    03/01/2014 12:51 PM      Result Value Ref Range   Sodium 143  137 - 147 mEq/L   Potassium 5.1  3.7 - 5.3 mEq/L   Chloride 100  96 - 112 mEq/L   CO2 17 (*) 19 - 32 mEq/L   Glucose, Bld 193 (*) 70 - 99 mg/dL   BUN 77 (*) 6 - 23 mg/dL   Creatinine, Ser 1.88 (*) 0.50 - 1.35 mg/dL   Calcium 6.8 (*) 8.4 - 10.5 mg/dL   GFR calc non Af Amer 32 (*) >90 mL/min   GFR calc Af Amer 37 (*) >90 mL/min   Comment: (NOTE)     The eGFR has been calculated using the CKD EPI equation.     This calculation has not been validated in all clinical situations.     eGFR's persistently <90 mL/min signify possible Chronic Kidney     Disease.   Anion gap 26 (*) 5 - 15  PRO B NATRIURETIC PEPTIDE     Status: Abnormal   Collection Time    02/24/2014 12:51 PM      Result Value Ref Range   Pro B Natriuretic peptide (BNP) 2829.0 (*) 0 - 450 pg/mL  TROPONIN I     Status: None   Collection Time    02/14/2014 12:51 PM      Result Value Ref Range   Troponin I <0.30  <0.30 ng/mL   Comment:            Due to the release kinetics of cTnI,     a negative result within the first hours     of the onset of symptoms does not rule out     myocardial infarction with certainty.  If myocardial infarction is still suspected,     repeat the test at appropriate intervals.  Randolm Idol, ED     Status: Abnormal   Collection Time    02/17/2014  1:01 PM      Result Value Ref Range   Troponin i, poc 0.13 (*) 0.00 - 0.08 ng/mL   Comment NOTIFIED PHYSICIAN     Comment 3            Comment: Due to the release kinetics of cTnI,     a negative result within the first hours     of the  onset of symptoms does not rule out     myocardial infarction with certainty.     If myocardial infarction is still suspected,     repeat the test at appropriate intervals.  TROPONIN I     Status: None   Collection Time    03/06/2014  7:02 PM      Result Value Ref Range   Troponin I <0.30  <0.30 ng/mL   Comment:            Due to the release kinetics of cTnI,     a negative result within the first hours     of the onset of symptoms does not rule out     myocardial infarction with certainty.     If myocardial infarction is still suspected,     repeat the test at appropriate intervals.  TSH     Status: Abnormal   Collection Time    02/27/2014  7:02 PM      Result Value Ref Range   TSH 64.350 (*) 0.350 - 4.500 uIU/mL  HEPATIC FUNCTION PANEL     Status: Abnormal   Collection Time    02/23/2014  7:02 PM      Result Value Ref Range   Total Protein 6.2  6.0 - 8.3 g/dL   Albumin 2.5 (*) 3.5 - 5.2 g/dL   AST 617 (*) 0 - 37 U/L   ALT 528 (*) 0 - 53 U/L   Alkaline Phosphatase 250 (*) 39 - 117 U/L   Total Bilirubin 1.3 (*) 0.3 - 1.2 mg/dL   Bilirubin, Direct 0.7 (*) 0.0 - 0.3 mg/dL   Indirect Bilirubin 0.6  0.3 - 0.9 mg/dL    Dg Chest 2 View  02/17/2014   CLINICAL DATA:  One-week history of weakness and shortness of breath with decreased appetite ; history of diabetes  EXAM: CHEST  2 VIEW  COMPARISON:  Portable chest x-ray of March 22, 2012  FINDINGS: There is new abnormal increased interstitial density posteriorly in the right upper lobe. Elsewhere the right lung is clear as is the left lung. There is no pleural effusion. The mediastinum is not shifted. The heart is top-normal in size. The pulmonary vascularity is not engorged. The bony thorax is unremarkable.  IMPRESSION: New increased density in the right upper lobe posteriorly is most compatible with pneumonia. Follow-up chest film films following therapy are recommended to assure clearing. Chest CT scanning may be ultimately indicated if  the findings/ clinical symptoms are unresponsive to therapy.   Electronically Signed   By: David  Martinique   On: 03/04/2014 13:38   Ct Chest Wo Contrast  02/15/2014   CLINICAL DATA:  Shortness of breath.  EXAM: CT CHEST WITHOUT CONTRAST  TECHNIQUE: Multidetector CT imaging of the chest was performed following the standard protocol without IV contrast.  COMPARISON:  Chest radiograph of same day.  FINDINGS:  No pneumothorax or significant pleural effusion is noted. 8 mm nodule is noted in lingula best seen on image number 34 series 3. Large airspace opacity is noted posteriorly in the right upper lobe most consistent with pneumonia. There appears to be some degree of necrosis present within this suggesting developing abscess. 21 x 17 mm rounded density is seen inferior to this which may represent focal inflammation, but is concerning for possible neoplasm giving its well-defined appearance. Mild airspace opacity is seen medially in the right lower lobe consistent with focal pneumonia.  No significant osseous abnormality is noted. Moderate pericardial effusion is noted. Atherosclerotic calcifications of thoracic aorta are noted without aneurysm formation. 31 x 22 mm left adrenal mass is noted.  IMPRESSION: Large right upper lobe pneumonia is noted, with air present within it suggesting parenchymal necrosis and possible developing abscess. Smaller airspace opacity is seen medially in the right lower lobe also concerning for pneumonia.  21 x 17 mm rounded density seen more inferiorly in the right upper lobe which is concerning for possible neoplasm or malignancy. Followup CT is recommended in 2-3 weeks to see if this represents inflammation; if it is persistent, further evaluation with PET scan or biopsy is recommended.  3.1 cm left adrenal mass is noted. Further evaluation with MRI is recommended to evaluate for possible neoplasm.  Moderate pericardial effusion is noted.  8 mm left lingular nodule is noted. If the  abnormality in the right upper lobe is determined to be malignant, this would be consistent with metastatic disease. Otherwise, if the patient is at high risk for bronchogenic carcinoma, follow-up chest CT at 3-106month is recommended. If the patient is at low risk for bronchogenic carcinoma, follow-up chest CT at 6-12 months is recommended. This recommendation follows the consensus statement: Guidelines for Management of Small Pulmonary Nodules Detected on CT Scans: A Statement from the FVan Hornas published in Radiology 2005; 237:395-400.   Electronically Signed   By: JSabino DickM.D.   On: 02/24/2014 16:11    Review of Systems  Constitutional: Positive for weight loss and malaise/fatigue. Negative for fever and chills.  HENT: Negative.   Eyes: Negative.   Respiratory: Positive for cough, hemoptysis, sputum production and shortness of breath.   Cardiovascular: Negative for chest pain, orthopnea, leg swelling and PND.  Gastrointestinal: Negative.   Genitourinary: Negative.   Musculoskeletal: Negative.   Skin: Negative.   Neurological: Positive for weakness.  Endo/Heme/Allergies: Negative.   Psychiatric/Behavioral: Negative.    Blood pressure 128/100, pulse 97, temperature 98 F (36.7 C), temperature source Oral, resp. rate 19, height 5' 10"  (1.778 m), weight 62.596 kg (138 lb), SpO2 97.00%. Physical Exam  Constitutional:  Elderly, frail, chronically-ill gentleman in no distress  HENT:  Head: Normocephalic and atraumatic.  Mouth/Throat: Oropharynx is clear and moist.  Eyes: EOM are normal. Pupils are equal, round, and reactive to light.  Neck: Normal range of motion. Neck supple. No JVD present. No thyromegaly present.  Cardiovascular: Normal rate and regular rhythm.  Exam reveals no friction rub.   No murmur heard. Respiratory: Effort normal. He has no wheezes.  Decreased breath sounds over the right upper lobe.  GI: Soft. Bowel sounds are normal. He exhibits no distension  and no mass. There is no tenderness.  Musculoskeletal: Normal range of motion. He exhibits no edema.  Lymphadenopathy:    He has no cervical adenopathy.  Neurological: He is alert. He has normal strength. No sensory deficit.  Skin: Skin is warm and dry.  Psychiatric: He has a normal mood and affect.   CLINICAL DATA: Shortness of breath.  EXAM:  CT CHEST WITHOUT CONTRAST  TECHNIQUE:  Multidetector CT imaging of the chest was performed following the  standard protocol without IV contrast.  COMPARISON: Chest radiograph of same day.  FINDINGS:  No pneumothorax or significant pleural effusion is noted. 8 mm  nodule is noted in lingula best seen on image number 34 series 3.  Large airspace opacity is noted posteriorly in the right upper lobe  most consistent with pneumonia. There appears to be some degree of  necrosis present within this suggesting developing abscess. 21 x 17  mm rounded density is seen inferior to this which may represent  focal inflammation, but is concerning for possible neoplasm giving  its well-defined appearance. Mild airspace opacity is seen medially  in the right lower lobe consistent with focal pneumonia.  No significant osseous abnormality is noted. Moderate pericardial  effusion is noted. Atherosclerotic calcifications of thoracic aorta  are noted without aneurysm formation. 31 x 22 mm left adrenal mass  is noted.  IMPRESSION:  Large right upper lobe pneumonia is noted, with air present within  it suggesting parenchymal necrosis and possible developing abscess.  Smaller airspace opacity is seen medially in the right lower lobe  also concerning for pneumonia.  21 x 17 mm rounded density seen more inferiorly in the right upper  lobe which is concerning for possible neoplasm or malignancy.  Followup CT is recommended in 2-3 weeks to see if this represents  inflammation; if it is persistent, further evaluation with PET scan  or biopsy is recommended.  3.1 cm  left adrenal mass is noted. Further evaluation with MRI is  recommended to evaluate for possible neoplasm.  Moderate pericardial effusion is noted.  8 mm left lingular nodule is noted. If the abnormality in the right  upper lobe is determined to be malignant, this would be consistent  with metastatic disease. Otherwise, if the patient is at high risk  for bronchogenic carcinoma, follow-up chest CT at 3-8month is  recommended. If the patient is at low risk for bronchogenic  carcinoma, follow-up chest CT at 6-12 months is recommended. This  recommendation follows the consensus statement: Guidelines for  Management of Small Pulmonary Nodules Detected on CT Scans: A  Statement from the FUnicoias published in Radiology  2005; 237:395-400.  Electronically Signed  By: JSabino DickM.D.  On: 02/17/2014 16:11      *[place] [phone] ------------------------------------------------------------------- Transthoracic Echocardiography  Patient: MKhoi, HambergerMR #: [id number] Date: 03/03/2014 Gender: M Age: [age]Height: Weight: BSA: Pt. Status: Room:  OSim Boast MD SONOGRAPHER MUla Lingo RDCS ADMITTING KWynelle LinkATTENDING KWynelle LinkPERFORMING [facility], Inpatient  cc:  ------------------------------------------------------------------- LV EF: 55% - 60%  ------------------------------------------------------------------- Indications: Dyspnea 786.09.  ------------------------------------------------------------------- History: PMH: ETOH.  ------------------------------------------------------------------- Study Conclusions  - Left ventricle: The cavity size was normal. Wall thickness was increased in a pattern of mild LVH. Systolic function was normal. The estimated ejection fraction was in the range of 55% to 60%. - Right ventricle: Marked thickening of  the free wall with likely infiltrating mass. Near RVOT there is a large encapsulated mass measuring 3.0 x 2.5 cm on short axis views. - Atrial septum: No defect or patent foramen ovale was identified. - Pericardium, extracardiac: Large pericardial effusion with pre tamponade physiology although IVC seems to collapse Contstritive evaluation  poor due to rapid arrhythimia - Impressions: Discussed with Dr Rayann Heman Given clinical history and Mass on RV likely malignant effusion. He will ask Dr Cyndia Bent to see regarding pericardial window and consider cardiac MRI for further evaluation.  Impressions:  - Discussed with Dr Rayann Heman Given clinical history and Mass on RV likely malignant effusion. He will ask Dr Cyndia Bent to see regarding pericardial window and consider cardiac MRI for further evaluation.  Transthoracic echocardiography. M-mode, complete 2D, spectral Doppler, and color Doppler. Birthdate: Patient birthdate: April 27, 1930. Age: Patient is 80 yr old. Sex: Gender: male. Blood pressure: 128/70 Patient status: Inpatient. Study date: Study date: 03/12/2014. Study time: 02:34 PM. Location: Emergency department.  -------------------------------------------------------------------  ------------------------------------------------------------------- Left ventricle: The cavity size was normal. Wall thickness was increased in a pattern of mild LVH. Systolic function was normal. The estimated ejection fraction was in the range of 55% to 60%.  ------------------------------------------------------------------- Aortic valve: Mildly calcified leaflets. Doppler: There was no stenosis.  ------------------------------------------------------------------- Mitral valve: Mildly thickened leaflets . Doppler: There was no significant regurgitation.  ------------------------------------------------------------------- Left atrium: The atrium was normal in  size.  ------------------------------------------------------------------- Atrial septum: No defect or patent foramen ovale was identified.  ------------------------------------------------------------------- Right ventricle: Marked thickening of the free wall with likely infiltrating mass. Near RVOT there is a large encapsulated mass measuring 3.0 x 2.5 cm on short axis views.  ------------------------------------------------------------------- Pulmonic valve: Doppler: There was mild regurgitation.  ------------------------------------------------------------------- Tricuspid valve: Doppler: There was mild regurgitation.  ------------------------------------------------------------------- Right atrium: The atrium was normal in size.  ------------------------------------------------------------------- Pericardium: Large pericardial effusion with pre tamponade physiology although IVC seems to collapse Contstritive evaluation poor due to rapid arrhythimia  ------------------------------------------------------------------- Measurements  Left ventricle Value Reference LV ID, ED, PLAX chordal (L) 35.6 mm 43 - 52 LV ID, ES, PLAX chordal 25.1 mm 23 - 38 LV fx shortening, PLAX chordal 29 % >=29 LV PW thickness, ED 10.5 mm --------- IVS/LV PW ratio, ED 1.05 <=1.3  Ventricular septum Value Reference IVS thickness, ED 11 mm ---------  Aorta Value Reference Aortic root ID, ED 31 mm ---------  Left atrium Value Reference LA ID, A-P, ES 27 mm ---------  Mitral valve Value Reference Mitral E-wave peak velocity 59.2 cm/s --------- Mitral A-wave peak velocity 88.3 cm/s --------- Mitral deceleration time (H) 335 ms 150 - 230 Mitral E/A ratio, peak 0.7 ---------  Tricuspid valve Value Reference Tricuspid peak RV-RA gradient 23 mm Hg --------- Tricuspid maximal regurg velocity, 240 cm/s --------- PISA  Legend: (L) and (H) mark values outside specified reference  range.  ------------------------------------------------------------------- Prepared and Electronically Authenticated by  Jenkins Rouge, M.D. 08/08/2015T16:10:24    Assessment/Plan:  He has a large pericardial effusion with no tamponade and intramyocardial masses suggesting a metastatic effusion with cardiac metastases. There is also a lesion in the right upper lobe that could be a primary lung cancer and a metastatic lesion in the left adrenal. There is extensive pneumonia in the right upper lobe. He is 46 with failure to thrive and I don't think he is likely to get off a ventilator if a pericardial window is done under general anesthesia. This is most likely a stage IV cancer that is end stage and I would recommend the most conservative treatment possible. If this effusion does require drainage I would attempt pericardiocentesis. I discussed the clinical situation with the patient and his two daughters and they seem to understand.   Gaye Pollack 02/23/2014, 8:36 PM

## 2014-03-07 NOTE — ED Notes (Signed)
IV attempt x 2 unsuccessful.

## 2014-03-07 NOTE — Progress Notes (Signed)
ANTIBIOTIC CONSULT NOTE - INITIAL  Pharmacy Consult for vancomycin and zosyn  Indication: pneumonia  No Known Allergies  Patient Measurements:    Body Weight: 138 lb per pt  Vital Signs: Temp: 97.5 F (36.4 C) (08/22 1602) Temp src: Oral (08/22 1602) BP: 102/57 mmHg (08/22 1602) Pulse Rate: 80 (08/22 1602) Intake/Output from previous day:   Intake/Output from this shift:    Labs:  Recent Labs  03/01/2014 1251  WBC 16.2*  HGB 11.4*  PLT 477*  CREATININE 1.88*   CrCl is unknown because there is no height on file for the current visit. No results found for this basename: VANCOTROUGH, VANCOPEAK, VANCORANDOM, GENTTROUGH, GENTPEAK, GENTRANDOM, TOBRATROUGH, TOBRAPEAK, TOBRARND, AMIKACINPEAK, AMIKACINTROU, AMIKACIN,  in the last 72 hours   Microbiology: No results found for this or any previous visit (from the past 720 hour(s)).  Medical History: Past Medical History  Diagnosis Date  . Hypertension   . Spinal stenosis   . Thyroid disease   . Diabetes mellitus   . Cataract   . Weakness   . Fatigue   . Lack of coordination   . Stroke   . Gout   . Prostate cancer     followed at [facility]  . Breast cancer     reports cancer with L breast excision 60 years ago, he has no further details    Medications:  See medication history Assessment: 80 yo man to start broad spectrum antibiotics for PNA.  CrCl ~ 30 ml/min  Goal of Therapy:  Vancomycin trough level 15-20 mcg/ml  Plan:  Zosyn 3.375 gm IV q8 hours Vancomycin 1250 mg IV x 1 then 750 mg IV q24 hours F/u renal function, cultures and clinical course  Thanks for allowing pharmacy to be a part of this patient's care.  Excell Seltzer, PharmD Clinical Pharmacist, [phone] 02/20/2014,4:29 PM

## 2014-03-07 NOTE — Progress Notes (Signed)
RT did consult for pulmonary toilet with pt. Pt was shown and instructed on Flutter. Pt had a NPC, weak cough. Pt states he does not feel he has anything to cough up and has not all day. RT will continue to monitor pt and work with flutter.

## 2014-03-07 NOTE — ED Notes (Addendum)
Pt states hes felt increasingly SOB and weak this week. His daughter brought him today because "he could hardly get out of the bed." pt reports poor oral intake thiks week, he states "i just havent felt like eating." he denies any pain. He is alert, breathing mildly labored while at rest in triage but he is able to speak in short sentences. Difficulty obtaining pulse ox and temp readings at triage

## 2014-03-07 NOTE — ED Notes (Signed)
Cardiology at the bedside.

## 2014-03-08 ENCOUNTER — Encounter (HOSPITAL_COMMUNITY): Admission: EM | Disposition: E | Payer: Self-pay | Source: Home / Self Care | Attending: Internal Medicine

## 2014-03-08 DIAGNOSIS — C380 Malignant neoplasm of heart: Secondary | ICD-10-CM | POA: Diagnosis present

## 2014-03-08 DIAGNOSIS — E039 Hypothyroidism, unspecified: Secondary | ICD-10-CM

## 2014-03-08 DIAGNOSIS — N179 Acute kidney failure, unspecified: Secondary | ICD-10-CM

## 2014-03-08 DIAGNOSIS — I4891 Unspecified atrial fibrillation: Secondary | ICD-10-CM

## 2014-03-08 DIAGNOSIS — I3139 Other pericardial effusion (noninflammatory): Secondary | ICD-10-CM | POA: Diagnosis present

## 2014-03-08 DIAGNOSIS — I314 Cardiac tamponade: Secondary | ICD-10-CM

## 2014-03-08 DIAGNOSIS — A419 Sepsis, unspecified organism: Secondary | ICD-10-CM

## 2014-03-08 DIAGNOSIS — I5189 Other ill-defined heart diseases: Secondary | ICD-10-CM

## 2014-03-08 DIAGNOSIS — I313 Pericardial effusion (noninflammatory): Secondary | ICD-10-CM | POA: Diagnosis present

## 2014-03-08 DIAGNOSIS — I369 Nonrheumatic tricuspid valve disorder, unspecified: Secondary | ICD-10-CM

## 2014-03-08 DIAGNOSIS — I319 Disease of pericardium, unspecified: Secondary | ICD-10-CM

## 2014-03-08 HISTORY — PX: PERICARDIAL TAP: SHX5486

## 2014-03-08 LAB — BASIC METABOLIC PANEL
ANION GAP: 25 — AB (ref 5–15)
BUN: 79 mg/dL — ABNORMAL HIGH (ref 6–23)
CHLORIDE: 102 meq/L (ref 96–112)
CO2: 16 mEq/L — ABNORMAL LOW (ref 19–32)
Calcium: 6.5 mg/dL — ABNORMAL LOW (ref 8.4–10.5)
Creatinine, Ser: 2.26 mg/dL — ABNORMAL HIGH (ref 0.50–1.35)
GFR calc non Af Amer: 25 mL/min — ABNORMAL LOW (ref >90)
GFR, EST AFRICAN AMERICAN: 29 mL/min — AB (ref >90)
Glucose, Bld: 173 mg/dL — ABNORMAL HIGH (ref 70–99)
POTASSIUM: 5.1 meq/L (ref 3.7–5.3)
SODIUM: 143 meq/L (ref 137–147)

## 2014-03-08 LAB — GLUCOSE, SEROUS FLUID: GLUCOSE FL: 119 mg/dL

## 2014-03-08 LAB — GRAM STAIN

## 2014-03-08 LAB — BODY FLUID CELL COUNT WITH DIFFERENTIAL
Lymphs, Fluid: 36 %
Monocyte-Macrophage-Serous Fluid: 49 % — ABNORMAL LOW (ref 50–90)
NEUTROPHIL FLUID: 15 % (ref 0–25)
Total Nucleated Cell Count, Fluid: 3532 cu mm — ABNORMAL HIGH (ref 0–1000)

## 2014-03-08 LAB — PH, BODY FLUID: PH, FLUID: 8.5

## 2014-03-08 LAB — CBC
HEMATOCRIT: 33.4 % — AB (ref 39.0–52.0)
Hemoglobin: 11 g/dL — ABNORMAL LOW (ref 13.0–17.0)
MCH: 29.3 pg (ref 26.0–34.0)
MCHC: 32.9 g/dL (ref 30.0–36.0)
MCV: 88.8 fL (ref 78.0–100.0)
Platelets: 437 10*3/uL — ABNORMAL HIGH (ref 150–400)
RBC: 3.76 MIL/uL — AB (ref 4.22–5.81)
RDW: 16.2 % — ABNORMAL HIGH (ref 11.5–15.5)
WBC: 20.5 10*3/uL — ABNORMAL HIGH (ref 4.0–10.5)

## 2014-03-08 LAB — LACTATE DEHYDROGENASE, PLEURAL OR PERITONEAL FLUID: LD, Fluid: 3328 U/L — ABNORMAL HIGH (ref 3–23)

## 2014-03-08 LAB — CREATININE, FLUID (PLEURAL, PERITONEAL, JP DRAINAGE): CREAT FL: 2.6 mg/dL

## 2014-03-08 LAB — MAGNESIUM: Magnesium: 1.9 mg/dL (ref 1.5–2.5)

## 2014-03-08 LAB — TROPONIN I

## 2014-03-08 LAB — HEMOGLOBIN A1C
HEMOGLOBIN A1C: 6.3 % — AB (ref <5.7)
Mean Plasma Glucose: 134 mg/dL — ABNORMAL HIGH (ref <117)

## 2014-03-08 LAB — AMYLASE, PLEURAL FLUID: Amylase, Pleural Fluid: 7 U/L

## 2014-03-08 LAB — PROTEIN, BODY FLUID: Total protein, fluid: 4.2 g/dL

## 2014-03-08 SURGERY — PERICARDIAL TAP
Anesthesia: LOCAL

## 2014-03-08 MED ORDER — MIDAZOLAM HCL 2 MG/2ML IJ SOLN
INTRAMUSCULAR | Status: AC
  Filled 2014-03-08: qty 2

## 2014-03-08 MED ORDER — LIDOCAINE HCL (PF) 1 % IJ SOLN
INTRAMUSCULAR | Status: AC
  Filled 2014-03-08: qty 30

## 2014-03-08 MED ORDER — PIPERACILLIN-TAZOBACTAM 3.375 G IVPB
3.3750 g | Freq: Three times a day (TID) | INTRAVENOUS | Status: DC
  Administered 2014-03-08 – 2014-03-10 (×8): 3.375 g via INTRAVENOUS
  Filled 2014-03-08 (×10): qty 50

## 2014-03-08 MED ORDER — HEPARIN (PORCINE) IN NACL 2-0.9 UNIT/ML-% IJ SOLN
INTRAMUSCULAR | Status: AC
  Filled 2014-03-08: qty 500

## 2014-03-08 MED ORDER — ALBUTEROL SULFATE (2.5 MG/3ML) 0.083% IN NEBU
2.5000 mg | INHALATION_SOLUTION | RESPIRATORY_TRACT | Status: DC
  Administered 2014-03-08: 2.5 mg via RESPIRATORY_TRACT

## 2014-03-08 MED ORDER — SODIUM CHLORIDE 0.9 % IV BOLUS (SEPSIS)
500.0000 mL | Freq: Once | INTRAVENOUS | Status: AC
  Administered 2014-03-08: 15:00:00 via INTRAVENOUS

## 2014-03-08 MED ORDER — SODIUM CHLORIDE 0.9 % IV BOLUS (SEPSIS)
500.0000 mL | Freq: Once | INTRAVENOUS | Status: AC
Start: 2014-03-08 — End: 2014-03-08
  Administered 2014-03-08: 09:00:00 via INTRAVENOUS

## 2014-03-08 MED ORDER — SODIUM CHLORIDE 0.9 % IV BOLUS (SEPSIS)
250.0000 mL | Freq: Once | INTRAVENOUS | Status: AC
Start: 2014-03-08 — End: 2014-03-08
  Administered 2014-03-08: 250 mL via INTRAVENOUS

## 2014-03-08 MED ORDER — SODIUM CHLORIDE 0.9 % IV BOLUS (SEPSIS): 500.0000 mL | Freq: Once | INTRAVENOUS | Status: AC

## 2014-03-08 MED ORDER — ALBUTEROL SULFATE (2.5 MG/3ML) 0.083% IN NEBU
INHALATION_SOLUTION | RESPIRATORY_TRACT | Status: AC
  Filled 2014-03-08: qty 3

## 2014-03-08 MED ORDER — LEVALBUTEROL HCL 0.63 MG/3ML IN NEBU
0.6300 mg | INHALATION_SOLUTION | Freq: Four times a day (QID) | RESPIRATORY_TRACT | Status: DC
  Administered 2014-03-08 – 2014-03-09 (×6): 0.63 mg via RESPIRATORY_TRACT
  Filled 2014-03-08 (×12): qty 3

## 2014-03-08 MED ORDER — NOREPINEPHRINE BITARTRATE 1 MG/ML IV SOLN
2.0000 ug/min | INTRAVENOUS | Status: DC
  Filled 2014-03-08: qty 4

## 2014-03-08 MED ORDER — BIOTENE DRY MOUTH MT LIQD
15.0000 mL | OROMUCOSAL | Status: DC | PRN
  Administered 2014-03-08 – 2014-03-09 (×3): 15 mL via OROMUCOSAL

## 2014-03-08 MED ORDER — VANCOMYCIN HCL IN DEXTROSE 1-5 GM/200ML-% IV SOLN
1000.0000 mg | INTRAVENOUS | Status: DC
  Administered 2014-03-10: 1000 mg via INTRAVENOUS
  Filled 2014-03-08: qty 200

## 2014-03-08 MED ORDER — VANCOMYCIN HCL 10 G IV SOLR
1250.0000 mg | Freq: Once | INTRAVENOUS | Status: AC
  Administered 2014-03-08: 1250 mg via INTRAVENOUS
  Filled 2014-03-08: qty 1250

## 2014-03-08 NOTE — Progress Notes (Signed)
Physician made aware of New EKG tracing. Also made aware of pt's lack of urine output since this afternoon. Will continue to monitor patient closely.

## 2014-03-08 NOTE — Progress Notes (Signed)
Subjective:   80 year old male with diabetes, remote breast cancer excision many years ago, and prostate cancer followed at [facility] who was admitted 8/22 with failure to thrive and weight loss. Echo showed a large pericardial effusion without tamponade with thickening of the RV free wall with a well defined intramuscular mass . He had a CT scan of the chest that showed extensive pneumonia in the RUL of the lung with extensive necrosis suggesting possible lung abscess. There is also a 21 x 17 mm rounded density in the RUL. There is also a 3 cm left adrenal mass concerning for metastatic disease.  He has been seen by Dr. Cyndia Bent and felt not to be candidate for pericardial window. Palliative Care has been discussed by the primary team and consultants.   Last night developed AF with controlled rate. Voltage very low on ECG. Renal function continues to get worse. Cr 1.88-> 2.26. SBP running in 70-80s and got 500cc bolus.   Dyspneic at rest   Intake/Output Summary (Last 24 hours) at 02/15/2014 1005 Last data filed at 03/04/2014 0800  Gross per 24 hour  Intake   2715 ml  Output     50 ml  Net   2665 ml    Current meds: . aspirin EC  81 mg Oral Daily  . folic acid  1 mg Oral Daily  . heparin  5,000 Units Subcutaneous 3 times per day  . latanoprost  1 drop Both Eyes QHS  . levothyroxine  137 mcg Oral QAC breakfast  . multivitamin with minerals  1 tablet Oral Daily  . piperacillin-tazobactam (ZOSYN)  IV  3.375 g Intravenous 3 times per day  . sodium chloride  3 mL Intravenous Q12H  . thiamine  100 mg Oral Daily   Or  . thiamine  100 mg Intravenous Daily  . [START ON 04-Apr-2014] vancomycin  1,000 mg Intravenous Q48H   Infusions: . sodium chloride 125 mL/hr at 02/22/2014 2317     Objective:  Blood pressure 85/61, pulse 60, temperature 97.5 F (36.4 C), temperature source Oral, resp. rate 23, height 5\' 10"  (1.778 m), weight 63.2 kg (139 lb 5.3 oz), SpO2 93.00%. Weight change:    Physical Exam: GEN- ill appearing, weak HEENT- temporal wasting Oropharynx- clear  Neck- supple  JVP to ear Lungs- Clear to ausculation bilaterally, normal work of breathing  Heart- distant heart sounds, Irregular rate and rhythm with frequent ectopy, no m/r/g  GI- soft, NT, ND, + BS  Extremities- no clubbing, cyanosis, or edema  MS- diffuse muscle atrophy and wasting Skin- well healed surgical incision over his L breast area  Psych- flat affect  Neuro- appears weak but moves all extremities   Telemetry: AF with low volts  Lab Results: Basic Metabolic Panel:  Recent Labs Lab 02/26/2014 1251 02/27/2014 0315  NA 143 143  K 5.1 5.1  CL 100 102  CO2 17* 16*  GLUCOSE 193* 173*  BUN 77* 79*  CREATININE 1.88* 2.26*  CALCIUM 6.8* 6.5*  MG  --  1.9   Liver Function Tests:  Recent Labs Lab 02/14/2014 1902  AST 617*  ALT 528*  ALKPHOS 250*  BILITOT 1.3*  PROT 6.2  ALBUMIN 2.5*   No results found for this basename: LIPASE, AMYLASE,  in the last 168 hours No results found for this basename: AMMONIA,  in the last 168 hours CBC:  Recent Labs Lab 02/23/2014 1251 03/14/2014 0315  WBC 16.2* 20.5*  HGB 11.4* 11.0*  HCT 33.9*  33.4*  MCV 88.3 88.8  PLT 477* 437*   Cardiac Enzymes:  Recent Labs Lab 02/27/2014 1251 02/16/2014 1902 03/15/2014 0315  TROPONINI <0.30 <0.30 <0.30   BNP: No components found with this basename: POCBNP,  CBG:  Recent Labs Lab 02/27/2014 [place]*   Microbiology: Lab Results  Component Value Date   CULT NO GROWTH 03/22/2012   No results found for this basename: CULT, SDES,  in the last 168 hours  Imaging: Dg Chest 2 View  03/14/2014   CLINICAL DATA:  One-week history of weakness and shortness of breath with decreased appetite ; history of diabetes  EXAM: CHEST  2 VIEW  COMPARISON:  Portable chest x-ray of March 22, 2012  FINDINGS: There is new abnormal increased interstitial density posteriorly in the right upper lobe. Elsewhere the  right lung is clear as is the left lung. There is no pleural effusion. The mediastinum is not shifted. The heart is top-normal in size. The pulmonary vascularity is not engorged. The bony thorax is unremarkable.  IMPRESSION: New increased density in the right upper lobe posteriorly is most compatible with pneumonia. Follow-up chest film films following therapy are recommended to assure clearing. Chest CT scanning may be ultimately indicated if the findings/ clinical symptoms are unresponsive to therapy.   Electronically Signed   By: David  Martinique   On: 02/25/2014 13:38   Ct Chest Wo Contrast  02/22/2014   CLINICAL DATA:  Shortness of breath.  EXAM: CT CHEST WITHOUT CONTRAST  TECHNIQUE: Multidetector CT imaging of the chest was performed following the standard protocol without IV contrast.  COMPARISON:  Chest radiograph of same day.  FINDINGS: No pneumothorax or significant pleural effusion is noted. 8 mm nodule is noted in lingula best seen on image number 34 series 3. Large airspace opacity is noted posteriorly in the right upper lobe most consistent with pneumonia. There appears to be some degree of necrosis present within this suggesting developing abscess. 21 x 17 mm rounded density is seen inferior to this which may represent focal inflammation, but is concerning for possible neoplasm giving its well-defined appearance. Mild airspace opacity is seen medially in the right lower lobe consistent with focal pneumonia.  No significant osseous abnormality is noted. Moderate pericardial effusion is noted. Atherosclerotic calcifications of thoracic aorta are noted without aneurysm formation. 31 x 22 mm left adrenal mass is noted.  IMPRESSION: Large right upper lobe pneumonia is noted, with air present within it suggesting parenchymal necrosis and possible developing abscess. Smaller airspace opacity is seen medially in the right lower lobe also concerning for pneumonia.  21 x 17 mm rounded density seen more  inferiorly in the right upper lobe which is concerning for possible neoplasm or malignancy. Followup CT is recommended in 2-3 weeks to see if this represents inflammation; if it is persistent, further evaluation with PET scan or biopsy is recommended.  3.1 cm left adrenal mass is noted. Further evaluation with MRI is recommended to evaluate for possible neoplasm.  Moderate pericardial effusion is noted.  8 mm left lingular nodule is noted. If the abnormality in the right upper lobe is determined to be malignant, this would be consistent with metastatic disease. Otherwise, if the patient is at high risk for bronchogenic carcinoma, follow-up chest CT at 3-57months is recommended. If the patient is at low risk for bronchogenic carcinoma, follow-up chest CT at 6-12 months is recommended. This recommendation follows the consensus statement: Guidelines for Management of Small Pulmonary Nodules Detected on CT  Scans: A Statement from the [employer] as published in Radiology 2005; 237:395-400.   Electronically Signed   By: Sabino Dick M.D.   On: 02/23/2014 16:11     ASSESSMENT:  1. Pericardial effusion (likely malignant) with probable tamponade 2. Metastatic cancer (heart, lung, adrenal) - unclear primary 3. Acute renal failure 4. Failure to thrive 5. Atrial fibrillation 6. H/o breast and prostate CA 7. Lung abscess  PLAN/DISCUSSION:  He is terminally ill. Clinically appears to be in tamponade. I had a long talk with him and his family about how grave his situation is and told him that the likelihood he will survive 6 months is extremely low. He has decided to proceed with pericardiocentesis for comfort and diagnostic purposes. I will arrange this for today. Otherwise he would like to be DNR/DNI. No shock. Pressors ok.   I have d/w the IM Teaching Service.   The patient is critically ill with multiple organ systems failure and requires high complexity decision making for assessment and support,  frequent evaluation and titration of therapies, application of advanced monitoring technologies and extensive interpretation of multiple databases.   Critical Care Time devoted to patient care services described in this note is 45 Minutes.   LOS: 1 day  Glori Bickers, MD 02/19/2014, 10:05 AM

## 2014-03-08 NOTE — Progress Notes (Signed)
Internal Medicine Attending Admission Note Date: 02/17/2014  Patient name: Mike Zuniga Medical record number: [id number] Date of birth: 1930/02/22 Age: 80 y.o. Gender: male  I saw and evaluated the patient. I reviewed the resident's note and I agree with the resident's findings and plan as documented in the resident's note.  Chief Complaint(s): Shortness of breath  History - key components related to admission:  Mike Zuniga is an 80 year old man with a remote history of breast cancer, prostate cancer in remission, diabetes, hypertension, and tobacco abuse who has had a several month history of unintentional weight loss of 35 pounds and 2 weeks of increasing fatigue, shortness of breath, and generalized weakness. A neighbor checked in on him and noticed he was short of breath and called the apartment Freight forward[employer]. Arrangements were made to get him to the emergency department for further evaluation. He denies any fevers or chills but notes some hemoptysis with a productive cough. He was therefore admitted to the internal medicine teaching service for further evaluation and care.  Physical Exam - key components related to admission:  Filed Vitals:   02/27/2014 0500 03/12/2014 0600 03/14/2014 0700 03/06/2014 0732  BP: 70/50 109/70 85/61   Pulse:    60  Temp:    97.5 F (36.4 C)  TempSrc:    Oral  Resp: 20 23 23    Height:      Weight:      SpO2:    93%   General: Thin, well-developed man lying comfortably in bed in no acute distress. Neck: No supraclavicular adenopathy. Firm 1 cm cystic nodule to the left of midline that is mobile and likely represents a sebaceous cyst rather than a metastatic implant. Lungs: Bilateral anterior inspiratory crackles with diffuse expiratory wheezes. Heart: Distant heart sounds, irregularly irregular, no rubs appreciated.  Lab results:  Basic Metabolic Panel:  Recent Labs  03/13/2014 1251 02/20/2014 0315  NA 143 143  K 5.1 5.1  CL 100 102  CO2 17* 16*  GLUCOSE 193*  173*  BUN 77* 79*  CREATININE 1.88* 2.26*  CALCIUM 6.8* 6.5*  MG  --  1.9   Liver Function Tests:  Recent Labs  03/12/2014 1902  AST 617*  ALT 528*  ALKPHOS 250*  BILITOT 1.3*  PROT 6.2  ALBUMIN 2.5*   CBC:  Recent Labs  02/16/2014 1251 03/12/2014 0315  WBC 16.2* 20.5*  HGB 11.4* 11.0*  HCT 33.9* 33.4*  MCV 88.3 88.8  PLT 477* 437*   Cardiac Enzymes:  Recent Labs  02/17/2014 1251 02/28/2014 1902 02/28/2014 0315  TROPONINI <0.30 <0.30 <0.30   CBG:  Recent Labs  03/06/2014 1217  GLUCAP 128*   Thyroid Function Tests:  Recent Labs  03/05/2014 1902  TSH 64.350*   Misc. Labs:  HIV nonreactive ProBNP 2829 Blood culture pending 2 L nasal cannula ABG 7.37/28/62  Imaging results:  Dg Chest 2 View  03/01/2014   CLINICAL DATA:  One-week history of weakness and shortness of breath with decreased appetite ; history of diabetes  EXAM: CHEST  2 VIEW  COMPARISON:  Portable chest x-ray of March 22, 2012  FINDINGS: There is new abnormal increased interstitial density posteriorly in the right upper lobe. Elsewhere the right lung is clear as is the left lung. There is no pleural effusion. The mediastinum is not shifted. The heart is top-normal in size. The pulmonary vascularity is not engorged. The bony thorax is unremarkable.  IMPRESSION: New increased density in the right upper lobe posteriorly is most compatible with pneumonia.  Follow-up chest film films following therapy are recommended to assure clearing. Chest CT scanning may be ultimately indicated if the findings/ clinical symptoms are unresponsive to therapy.   Electronically Signed   By: David  Martinique   On: 03/06/2014 13:38   Ct Chest Wo Contrast  02/19/2014   CLINICAL DATA:  Shortness of breath.  EXAM: CT CHEST WITHOUT CONTRAST  TECHNIQUE: Multidetector CT imaging of the chest was performed following the standard protocol without IV contrast.  COMPARISON:  Chest radiograph of same day.  FINDINGS: No pneumothorax or  significant pleural effusion is noted. 8 mm nodule is noted in lingula best seen on image number 34 series 3. Large airspace opacity is noted posteriorly in the right upper lobe most consistent with pneumonia. There appears to be some degree of necrosis present within this suggesting developing abscess. 21 x 17 mm rounded density is seen inferior to this which may represent focal inflammation, but is concerning for possible neoplasm giving its well-defined appearance. Mild airspace opacity is seen medially in the right lower lobe consistent with focal pneumonia.  No significant osseous abnormality is noted. Moderate pericardial effusion is noted. Atherosclerotic calcifications of thoracic aorta are noted without aneurysm formation. 31 x 22 mm left adrenal mass is noted.  IMPRESSION: Large right upper lobe pneumonia is noted, with air present within it suggesting parenchymal necrosis and possible developing abscess. Smaller airspace opacity is seen medially in the right lower lobe also concerning for pneumonia.  21 x 17 mm rounded density seen more inferiorly in the right upper lobe which is concerning for possible neoplasm or malignancy. Followup CT is recommended in 2-3 weeks to see if this represents inflammation; if it is persistent, further evaluation with PET scan or biopsy is recommended.  3.1 cm left adrenal mass is noted. Further evaluation with MRI is recommended to evaluate for possible neoplasm.  Moderate pericardial effusion is noted.  8 mm left lingular nodule is noted. If the abnormality in the right upper lobe is determined to be malignant, this would be consistent with metastatic disease. Otherwise, if the patient is at high risk for bronchogenic carcinoma, follow-up chest CT at 3-6months is recommended. If the patient is at low risk for bronchogenic carcinoma, follow-up chest CT at 6-12 months is recommended. This recommendation follows the consensus statement: Guidelines for Management of Small  Pulmonary Nodules Detected on CT Scans: A Statement from the [employer] as published in Radiology 2005; 237:395-400.   Electronically Signed   By: Sabino Dick M.D.   On: 03/01/2014 16:11   Additional interpretation of the chest x-ray: Right upper lobe infiltrate and lateral inferior nodule associated with upward migration of the lateral minor fissure suggesting volume loss. The lateral view demonstrates an infiltrate in the posterior subsegment of the right upper lobe and an early S sign of Armandina Gemma along the major fissure superiorly.  Additional interpretation of the CT scan: Patent right upper lobe orifice to all 3 subsegments. Consolidation within the posterior subsegment of the right upper lobe and an air bronchogram leading to the lesion. Posterior to this lesion is a thickwalled abscess likely representing a postobstructive necrotic infectious process.  Other results:  EKG: Irregular rhythm with respiratory variation and possible electrical alternans  Echocardiogram: Large pericardial effusion without evidence of tampanadet physiology. Right ventricular mass.  Assessment & Plan by Problem:  Mike Zuniga is an 80 year old man with a history of prostate cancer in remission, remote history of breast cancer, diabetes, hypertension, alcohol abuse, and tobacco abuse  who presents with a six-month history of unintentional weight loss of 35 pounds and a two-week history of progressive shortness of breath, weakness and generalized fatigue. He was found to have a post obstructive process in the posterior subsegment of the right upper lobe associated with a necrotic infiltrate. There was evidence of volume loss in the right upper lobe. He was also found to have a large pericardial effusion associated with a right ventricular mass. CT scan confirmed the findings on the chest x-ray and also demonstrated a left adrenal mass. This constellation of symptoms and signs is concerning for a right upper lobe  posterior subsegmental bronchogenic carcinoma metastatic to the heart and pericardium and adrenal gland at the very least. If this turns out to be cancer it is stage IV and nonoperable. His prognosis is excessively poor especially given his recent functional status. I doubt he would be a candidate for chemotherapy given his low functional status were he to be proven to have metastatic lung cancer. He could be a candidate for palliative radiation therapy to the posterior segment of the right upper lower in order to assist with drainage of his postobstructive necrotic process. I doubt this would add much to the quality of his life and likely prolong it very little, if any. Mike Zuniga initially struggled with the concept of inoperable cancer but may be coming around to the idea of palliative care as a viable option. He is discussing these options with his family, but has now agreed to be DO NOT RESUSCITATE. If he were to pass away as would either be secondary to a non-responsive postobstructive pneumonia, hemoptysis from a bronchogenic carcinoma in the posterior subsegment of the right upper lobe, cardiac tampanade secondary to a malignant pericardial effusion, or arrhythmias related to metastatic disease irritating the cardiac conduction system. The fact that he is falling into atrial fibrillation suggests this is a possibility.  1) Likely metastatic bronchogenic carcinoma from the posterior subsegment of the right upper lobe resulting in a large malignant paracardial effusion with an associated right ventricular mass and left adrenal metastasis: At this point, tissue to prove that this is the diagnosis would be ideal as it would definitively clarify things for Mike Zuniga and his family. Clinically he is felt to have a pericardial tamponade that is hemodynamically significant at this time given his hypotension and atrial arrhythmias. Cardiology is arranging for her paracardiocentesis. This will be therapeutic, it is  hoped, at least temporarily for his likely malignant pericardial effusion. It will also provide [place] an opportunity to obtain cytology to definitively diagnose what we believe is a metastatic bronchogenic carcinoma. Therefore, the cytology from the paracardial fluid will be sent to look for evidence of malignancy. If this is positive, we could consult oncology to assess the patient, although his functional status will likely preclude any non-palliative chemotherapy. If the pericardial effusion does not diagnose a malignant process our next step would be to obtain sputum for cytology given the hemoptysis and the fact that a bronchus does lead to the lesion. The chances of making a diagnosis via sputum cytology in this situation is probably 50-50 at best. If the family would like to continue to pursue tissue if the above 2 methods are not productive, he could be a candidate for a bronchoscopy with transbronchial biopsy and brushing as well as washing of the posterior subsegment of the right upper lobe.  2) Right upper lobe posterior subsegment postobstructive pneumonia with necrosis: As postobstructive pneumonias are usually polymicrobial he  will be covered broadly with Zosyn and vancomycin. I am not hopeful this lesion will be cleared without drainage. Drainage is likely only possible if he were to respond to radiation therapy to the area to open up the obstructed lesion. This could be considered a possibility once definitive diagnosis of metastatic bronchogenic carcinoma is made, if such therapy would be appropriate.  3) Disposition: Mike Zuniga short-term prognosis is exceptionally poor. We will ask palliative care to assist Mike Zuniga and his family in working through the options that are available to him including hospice or home hospice. With his limited functional status I am doubtful he will be able to ever return home to live independently. Therefore, some kind of placement or arrangement with hospice will  likely be required upon discharge.

## 2014-03-08 NOTE — Progress Notes (Signed)
EKG CRITICAL VALUE     12 lead EKG performed.  Critical value noted.Rosebud Poles, RN notified.   Yarexi Pawlicki L, CCT 02/23/2014 8:06 AM

## 2014-03-08 NOTE — Interval H&P Note (Signed)
History and Physical Interval Note:  02/20/2014 11:54 AM  Mike Zuniga  has presented today for surgery, with the diagnosis of pericardial effusion  The various methods of treatment have been discussed with the patient and family. After consideration of risks, benefits and other options for treatment, the patient has consented to  Procedure(s): PERICARDIAL TAP (N/A) as a surgical intervention .  The patient's history has been reviewed, patient examined, no change in status, stable for surgery.  I have reviewed the patient's chart and labs.  Questions were answered to the patient's satisfaction.     [name] [facility] 02/14/2014 11:54 AM

## 2014-03-08 NOTE — CV Procedure (Signed)
    Pericardiocentesis Report   Indication: 80 yo BM with large pericardial effusion with clinical tamponade. Patient has probable bronchogenic lung CA and also has a right atrial mass.            Procedure: The subxiphoid area was prepped and draped in a sterile fashion. The area was anesthetized with 1% lidocaine. A pericardial needle was used to enter the pericardium from a subxiphoid approach. A J wire was advanced into the pericardial space under fluoro guidance. The needle as withdrawn and a pigtail catheter was advanced over the wire into the pericardial space. 470  Cc of brown/amber  pericardial fluid was withdrawn and sent for cytology, microbiology cultures, and chemistry. The pericardial drain was sewn in place and left to drain. The patient tolerated the procedure well without complications. Blood pressure improved post tap.  Impression:  Successful pericardiocentesis.   Plan: Await fluid analysis. Leave drain in place. Follow up Echo.     Vianka Ertel Martinique MD, [facility]  02/25/2014 12:30 PM

## 2014-03-08 NOTE — Progress Notes (Signed)
Utilization Review Completed.   Jonan Seufert, RN, BSN Nurse Case Manager  

## 2014-03-08 NOTE — Progress Notes (Signed)
Subjective:  Had some hemoptysis overnight with cough. BP remained low overnight in 70-80's. Remains on 5L o2 [place].  Last night was in Afib with controlled rate. Renal function worsening Cr 1.88 > 2.26.  Subjectively denies any SOB, cp, n/v, fever, chills. Just wants the procedure done so he can get done with everything. We explained to him in details that we cannot confirm that he has cancer but he has very high likely hood of metastatic uncurable cancer. He wants everything done to work it up even though we explained to him that based on his health status, it might be better for him to not take aggressive course but rather make him comfortable.    Objective: Vital signs in last 24 hours: Filed Vitals:   02/20/2014 0500 02/24/2014 0600 03/03/2014 0700 02/17/2014 0732  BP: 70/50 109/70 85/61   Pulse:    60  Temp:    97.5 F (36.4 C)  TempSrc:    Oral  Resp: 20 23 23    Height:      Weight:      SpO2:    93%   Weight change:   Intake/Output Summary (Last 24 hours) at 03/01/2014 1158 Last data filed at 02/17/2014 0800  Gross per 24 hour  Intake   2715 ml  Output     50 ml  Net   2665 ml   Vitals reviewed. General: resting in bed, appears to be in distress HEENT: PERRL, EOMI, no scleral icterus Cardiac: RRR, no rubs, murmurs or gallops Pulm: has diffuse exp wheezing and crackles bilaterally. Using abdominal and neck muscles to breathe. However, he denies subjective SOB. Lymph: there is a superficial, mobile, hard, ~1cm rounded lump on his anterior cervical chain area. Abd: soft, nontender, nondistended, BS present Ext: warm and well perfused, no pedal edema Neuro: alert and oriented X3, cranial nerves II-XII grossly intact, strength and sensation to light touch equal in bilateral upper and lower extremities  Lab Results: Basic Metabolic Panel:  Recent Labs Lab 02/20/2014 1251 02/19/2014 0315  NA 143 143  K 5.1 5.1  CL 100 102  CO2 17* 16*  GLUCOSE 193* 173*  BUN 77* 79*    CREATININE 1.88* 2.26*  CALCIUM 6.8* 6.5*  MG  --  1.9   Liver Function Tests:  Recent Labs Lab 03/13/2014 1902  AST 617*  ALT 528*  ALKPHOS 250*  BILITOT 1.3*  PROT 6.2  ALBUMIN 2.5*   No results found for this basename: LIPASE, AMYLASE,  in the last 168 hours No results found for this basename: AMMONIA,  in the last 168 hours CBC:  Recent Labs Lab 02/27/2014 1251 02/27/2014 0315  WBC 16.2* 20.5*  HGB 11.4* 11.0*  HCT 33.9* 33.4*  MCV 88.3 88.8  PLT 477* 437*   Cardiac Enzymes:  Recent Labs Lab 03/16/2014 1251 03/06/2014 1902 02/24/2014 0315  TROPONINI <0.30 <0.30 <0.30    Recent Labs Lab 02/14/2014 1251  PROBNP 2829.0*  CBG:  Recent Labs Lab 03/13/2014 1217  GLUCAP 128*  Thyroid Function Tests:  Recent Labs Lab 02/18/2014 1902  TSH 64.350*    Micro Results: No results found for this or any previous visit (from the past 240 hour(s)). Studies/Results: Dg Chest 2 View  02/24/2014   CLINICAL DATA:  One-week history of weakness and shortness of breath with decreased appetite ; history of diabetes  EXAM: CHEST  2 VIEW  COMPARISON:  Portable chest x-ray of March 22, 2012  FINDINGS: There is new abnormal increased interstitial  density posteriorly in the right upper lobe. Elsewhere the right lung is clear as is the left lung. There is no pleural effusion. The mediastinum is not shifted. The heart is top-normal in size. The pulmonary vascularity is not engorged. The bony thorax is unremarkable.  IMPRESSION: New increased density in the right upper lobe posteriorly is most compatible with pneumonia. Follow-up chest film films following therapy are recommended to assure clearing. Chest CT scanning may be ultimately indicated if the findings/ clinical symptoms are unresponsive to therapy.   Electronically Signed   By: David  Martinique   On: 02/20/2014 13:38   Ct Chest Wo Contrast  02/20/2014   CLINICAL DATA:  Shortness of breath.  EXAM: CT CHEST WITHOUT CONTRAST  TECHNIQUE:  Multidetector CT imaging of the chest was performed following the standard protocol without IV contrast.  COMPARISON:  Chest radiograph of same day.  FINDINGS: No pneumothorax or significant pleural effusion is noted. 8 mm nodule is noted in lingula best seen on image number 34 series 3. Large airspace opacity is noted posteriorly in the right upper lobe most consistent with pneumonia. There appears to be some degree of necrosis present within this suggesting developing abscess. 21 x 17 mm rounded density is seen inferior to this which may represent focal inflammation, but is concerning for possible neoplasm giving its well-defined appearance. Mild airspace opacity is seen medially in the right lower lobe consistent with focal pneumonia.  No significant osseous abnormality is noted. Moderate pericardial effusion is noted. Atherosclerotic calcifications of thoracic aorta are noted without aneurysm formation. 31 x 22 mm left adrenal mass is noted.  IMPRESSION: Large right upper lobe pneumonia is noted, with air present within it suggesting parenchymal necrosis and possible developing abscess. Smaller airspace opacity is seen medially in the right lower lobe also concerning for pneumonia.  21 x 17 mm rounded density seen more inferiorly in the right upper lobe which is concerning for possible neoplasm or malignancy. Followup CT is recommended in 2-3 weeks to see if this represents inflammation; if it is persistent, further evaluation with PET scan or biopsy is recommended.  3.1 cm left adrenal mass is noted. Further evaluation with MRI is recommended to evaluate for possible neoplasm.  Moderate pericardial effusion is noted.  8 mm left lingular nodule is noted. If the abnormality in the right upper lobe is determined to be malignant, this would be consistent with metastatic disease. Otherwise, if the patient is at high risk for bronchogenic carcinoma, follow-up chest CT at 3-41months is recommended. If the patient is at  low risk for bronchogenic carcinoma, follow-up chest CT at 6-12 months is recommended. This recommendation follows the consensus statement: Guidelines for Management of Small Pulmonary Nodules Detected on CT Scans: A Statement from the [place] as published in Radiology 2005; 237:395-400.   Electronically Signed   By: Sabino Dick M.D.   On: 02/17/2014 16:11   Medications: I have reviewed the patient's current medications. Scheduled Meds: . aspirin EC  81 mg Oral Daily  . folic acid  1 mg Oral Daily  . heparin  5,000 Units Subcutaneous 3 times per day  . latanoprost  1 drop Both Eyes QHS  . levothyroxine  137 mcg Oral QAC breakfast  . multivitamin with minerals  1 tablet Oral Daily  . piperacillin-tazobactam (ZOSYN)  IV  3.375 g Intravenous 3 times per day  . sodium chloride  3 mL Intravenous Q12H  . thiamine  100 mg Oral Daily   Or  .  thiamine  100 mg Intravenous Daily  . [START ON 03-22-2014] vancomycin  1,000 mg Intravenous Q48H   Continuous Infusions: . sodium chloride 125 mL/hr at 03/06/2014 2317   PRN Meds:.albuterol, ipratropium, LORazepam, LORazepam, oxyCODONE  80 yo male with smoker, alcohol abuser, hx of remote BCA, PCA, DM, HTN, who presents with fatigue and SOB for last 2 weeks, found to have PNA and lung nodules on CXR and chest CT, and pleural effusion+RV mass on echo.    Large Pericardial effusion with questionable tamponade, associate with RV mass  - likely malignant pericardial effusion from unknown metastatic source given hx of BCA and PCA and also nodules on lung, kidney, and adrenal glands.  - patient's BP remains low despite IV boluses. UOP slowly going up- will continue to support with IVF bolus and continuous fluids. - discussed pericardial window with CT surgery- he is too high risk respiratory wise for the procedure as he might never come off the vent.  - Dr. Haroldine Laws decided to take him for pericardiocentesis for comfort and diagnostic purpose.  Clinically he appears to be in tamponade. cytology from the paracardial fluid will be sent to look for evidence of malignancy.  - his chance of survival is very low and family understands his situation. Patient was initially unsure but he decided to to become DNR/DNI -pressors ok, no shock.   - f/up sputum cytology.  - f/up fluid analysis from pericardiocentesis.   Severe sepsis likely from postobstructive PNA and pulmonary abscess formation - CXR shows RUL posterior subsegment post-obstructive PNA which is confirmed by CT chest. There is some abscess formation and necrotic tissue in this area with upstream obstruction.  He is currently in vanc+zosyn for Post obs PNA but WBC is going up. Abscess needs to be drained but there is high risk of pneumothorax if it is done with CT guided drainage. It is not next to the airway where bronch would be helpful to drain it. Even if it's drained, there is high likelyhood of reaccumulation since there is obstruction upstream.  XRT might be the only option but that would be inconsistent with comfort care.   - it is hard to know the source since broncoscopic biopsy would be difficulty based on the location of the nodule/PNA as seen on chest CT. Sputum cytology might have good yield since patient has hemopytisis and productive cough.  - f/up sputum cytology -cont vanc+zosyn even though it will not treat the abscess - scheduled albuterol q4hr because of increased WOB, wheezing.  Acute Renal failure - likely 2/2 pre-renal from sepsis/tamponade.  - cont IVF's.  Afib- likely due to pericardial effusion or sepsis. -treat the cause.  Hypothyroidism - on home synthyroid  DM II- hold home metformin  PPX: heparin Diet:carb mod  Code: DNR/DNI, pressors are ok.  Dispo: there is a high chance he may pass shortly from his current medical condition. His family and the patient is aware of this. Will discuss hospice for him later on.   The patient does have a  current PCP Olevia Bowens, MD) and does need an [facility] hospital follow-up appointment after discharge.  The patient does have transportation limitations that hinder transportation to clinic appointments.  .Services Needed at time of discharge: Y = Yes, Blank = No PT:   OT:   RN:   Equipment:   Other:     LOS: 1 day   Dellia Nims, MD 03/06/2014, 11:58 AM

## 2014-03-08 NOTE — Progress Notes (Signed)
Physician made aware that pulse oximetry is not picking up on patient. Blood gases drawn for patients baseline. Physician made aware of results.

## 2014-03-08 NOTE — Progress Notes (Deleted)
Admit Complaint: weakness  PMH: smoker, ETOH abuse, DM, HTN, breast CA, prostate CA  Anticoagulation  Infectious Disease: D#1 Vanc/Zosyn for severe sepsis 2/2 post-obstructive pna, afebrile but WBC 20! CT chest shows multiple nodules, large posterior RUL PNA with air suggesting parenchymal necrosis and possible abscess,also 21x17 rounded density possibly neoplasm May need pericardial window - CT surgery on board  8/22 Vanc>> 8/22 Zosyn>> 8/22 blood x2>>  Cardiovascular  Endocrinology: synthroid  Gastrointestinal / Nutrition: LFTs elevated  Neurology  Nephrology: sCr doubled  Pulmonary  Hematology / Oncology  PTA Medication Issues: metformin  Best Practices

## 2014-03-08 NOTE — H&P (View-Only) (Signed)
Subjective:   80 year old male with diabetes, remote breast cancer excision many years ago, and prostate cancer followed at [facility] who was admitted 8/22 with failure to thrive and weight loss. Echo showed a large pericardial effusion without tamponade with thickening of the RV free wall with a well defined intramuscular mass . He had a CT scan of the chest that showed extensive pneumonia in the RUL of the lung with extensive necrosis suggesting possible lung abscess. There is also a 21 x 17 mm rounded density in the RUL. There is also a 3 cm left adrenal mass concerning for metastatic disease.  He has been seen by Dr. Cyndia Bent and felt not to be candidate for pericardial window. Palliative Care has been discussed by the primary team and consultants.   Last night developed AF with controlled rate. Voltage very low on ECG. Renal function continues to get worse. Cr 1.88-> 2.26. SBP running in 70-80s and got 500cc bolus.   Dyspneic at rest   Intake/Output Summary (Last 24 hours) at 02/22/2014 1005 Last data filed at 03/14/2014 0800  Gross per 24 hour  Intake   2715 ml  Output     50 ml  Net   2665 ml    Current meds: . aspirin EC  81 mg Oral Daily  . folic acid  1 mg Oral Daily  . heparin  5,000 Units Subcutaneous 3 times per day  . latanoprost  1 drop Both Eyes QHS  . levothyroxine  137 mcg Oral QAC breakfast  . multivitamin with minerals  1 tablet Oral Daily  . piperacillin-tazobactam (ZOSYN)  IV  3.375 g Intravenous 3 times per day  . sodium chloride  3 mL Intravenous Q12H  . thiamine  100 mg Oral Daily   Or  . thiamine  100 mg Intravenous Daily  . [START ON 03-14-2014] vancomycin  1,000 mg Intravenous Q48H   Infusions: . sodium chloride 125 mL/hr at 03/12/2014 2317     Objective:  Blood pressure 85/61, pulse 60, temperature 97.5 F (36.4 C), temperature source Oral, resp. rate 23, height 5\' 10"  (1.778 m), weight 63.2 kg (139 lb 5.3 oz), SpO2 93.00%. Weight change:    Physical Exam: GEN- ill appearing, weak HEENT- temporal wasting Oropharynx- clear  Neck- supple  JVP to ear Lungs- Clear to ausculation bilaterally, normal work of breathing  Heart- distant heart sounds, Irregular rate and rhythm with frequent ectopy, no m/r/g  GI- soft, NT, ND, + BS  Extremities- no clubbing, cyanosis, or edema  MS- diffuse muscle atrophy and wasting Skin- well healed surgical incision over his L breast area  Psych- flat affect  Neuro- appears weak but moves all extremities   Telemetry: AF with low volts  Lab Results: Basic Metabolic Panel:  Recent Labs Lab 03/06/2014 1251 02/18/2014 0315  NA 143 143  K 5.1 5.1  CL 100 102  CO2 17* 16*  GLUCOSE 193* 173*  BUN 77* 79*  CREATININE 1.88* 2.26*  CALCIUM 6.8* 6.5*  MG  --  1.9   Liver Function Tests:  Recent Labs Lab 03/15/2014 1902  AST 617*  ALT 528*  ALKPHOS 250*  BILITOT 1.3*  PROT 6.2  ALBUMIN 2.5*   No results found for this basename: LIPASE, AMYLASE,  in the last 168 hours No results found for this basename: AMMONIA,  in the last 168 hours CBC:  Recent Labs Lab 02/21/2014 1251 02/21/2014 0315  WBC 16.2* 20.5*  HGB 11.4* 11.0*  HCT 33.9*  33.4*  MCV 88.3 88.8  PLT 477* 437*   Cardiac Enzymes:  Recent Labs Lab 03/04/2014 1251 02/15/2014 1902 02/24/2014 0315  TROPONINI <0.30 <0.30 <0.30   BNP: No components found with this basename: POCBNP,  CBG:  Recent Labs Lab 03/14/2014 [place]*   Microbiology: Lab Results  Component Value Date   CULT NO GROWTH 03/22/2012   No results found for this basename: CULT, SDES,  in the last 168 hours  Imaging: Dg Chest 2 View  03/15/2014   CLINICAL DATA:  One-week history of weakness and shortness of breath with decreased appetite ; history of diabetes  EXAM: CHEST  2 VIEW  COMPARISON:  Portable chest x-ray of March 22, 2012  FINDINGS: There is new abnormal increased interstitial density posteriorly in the right upper lobe. Elsewhere the  right lung is clear as is the left lung. There is no pleural effusion. The mediastinum is not shifted. The heart is top-normal in size. The pulmonary vascularity is not engorged. The bony thorax is unremarkable.  IMPRESSION: New increased density in the right upper lobe posteriorly is most compatible with pneumonia. Follow-up chest film films following therapy are recommended to assure clearing. Chest CT scanning may be ultimately indicated if the findings/ clinical symptoms are unresponsive to therapy.   Electronically Signed   By: David  Martinique   On: 03/15/2014 13:38   Ct Chest Wo Contrast  02/23/2014   CLINICAL DATA:  Shortness of breath.  EXAM: CT CHEST WITHOUT CONTRAST  TECHNIQUE: Multidetector CT imaging of the chest was performed following the standard protocol without IV contrast.  COMPARISON:  Chest radiograph of same day.  FINDINGS: No pneumothorax or significant pleural effusion is noted. 8 mm nodule is noted in lingula best seen on image number 34 series 3. Large airspace opacity is noted posteriorly in the right upper lobe most consistent with pneumonia. There appears to be some degree of necrosis present within this suggesting developing abscess. 21 x 17 mm rounded density is seen inferior to this which may represent focal inflammation, but is concerning for possible neoplasm giving its well-defined appearance. Mild airspace opacity is seen medially in the right lower lobe consistent with focal pneumonia.  No significant osseous abnormality is noted. Moderate pericardial effusion is noted. Atherosclerotic calcifications of thoracic aorta are noted without aneurysm formation. 31 x 22 mm left adrenal mass is noted.  IMPRESSION: Large right upper lobe pneumonia is noted, with air present within it suggesting parenchymal necrosis and possible developing abscess. Smaller airspace opacity is seen medially in the right lower lobe also concerning for pneumonia.  21 x 17 mm rounded density seen more  inferiorly in the right upper lobe which is concerning for possible neoplasm or malignancy. Followup CT is recommended in 2-3 weeks to see if this represents inflammation; if it is persistent, further evaluation with PET scan or biopsy is recommended.  3.1 cm left adrenal mass is noted. Further evaluation with MRI is recommended to evaluate for possible neoplasm.  Moderate pericardial effusion is noted.  8 mm left lingular nodule is noted. If the abnormality in the right upper lobe is determined to be malignant, this would be consistent with metastatic disease. Otherwise, if the patient is at high risk for bronchogenic carcinoma, follow-up chest CT at 3-3months is recommended. If the patient is at low risk for bronchogenic carcinoma, follow-up chest CT at 6-12 months is recommended. This recommendation follows the consensus statement: Guidelines for Management of Small Pulmonary Nodules Detected on CT  Scans: A Statement from the [place] as published in Radiology 2005; 237:395-400.   Electronically Signed   By: Sabino Dick M.D.   On: 03/13/2014 16:11     ASSESSMENT:  1. Pericardial effusion (likely malignant) with probable tamponade 2. Metastatic cancer (heart, lung, adrenal) - unclear primary 3. Acute renal failure 4. Failure to thrive 5. Atrial fibrillation 6. H/o breast and prostate CA 7. Lung abscess  PLAN/DISCUSSION:  He is terminally ill. Clinically appears to be in tamponade. I had a long talk with him and his family about how grave his situation is and told him that the likelihood he will survive 6 months is extremely low. He has decided to proceed with pericardiocentesis for comfort and diagnostic purposes. I will arrange this for today. Otherwise he would like to be DNR/DNI. No shock. Pressors ok.   I have d/w the IM Teaching Service.   The patient is critically ill with multiple organ systems failure and requires high complexity decision making for assessment and support,  frequent evaluation and titration of therapies, application of advanced monitoring technologies and extensive interpretation of multiple databases.   Critical Care Time devoted to patient care services described in this note is 45 Minutes.   LOS: 1 day  Glori Bickers, MD 02/24/2014, 10:05 AM

## 2014-03-08 NOTE — Progress Notes (Signed)
  Echocardiogram 2D Echocardiogram limited has been performed.  Lauranne Beyersdorf FRANCES 02/19/2014, 2:53 PM

## 2014-03-09 ENCOUNTER — Encounter (HOSPITAL_COMMUNITY): Admission: EM | Disposition: E | Payer: Self-pay | Source: Home / Self Care | Attending: Internal Medicine

## 2014-03-09 DIAGNOSIS — E119 Type 2 diabetes mellitus without complications: Secondary | ICD-10-CM

## 2014-03-09 DIAGNOSIS — Z515 Encounter for palliative care: Secondary | ICD-10-CM

## 2014-03-09 DIAGNOSIS — J189 Pneumonia, unspecified organism: Secondary | ICD-10-CM

## 2014-03-09 DIAGNOSIS — I319 Disease of pericardium, unspecified: Secondary | ICD-10-CM

## 2014-03-09 DIAGNOSIS — E43 Unspecified severe protein-calorie malnutrition: Secondary | ICD-10-CM | POA: Insufficient documentation

## 2014-03-09 LAB — CBC WITH DIFFERENTIAL/PLATELET
BASOS PCT: 0 % (ref 0–1)
Basophils Absolute: 0 10*3/uL (ref 0.0–0.1)
EOS PCT: 2 % (ref 0–5)
Eosinophils Absolute: 0.4 10*3/uL (ref 0.0–0.7)
HCT: 32.4 % — ABNORMAL LOW (ref 39.0–52.0)
HEMOGLOBIN: 11.1 g/dL — AB (ref 13.0–17.0)
LYMPHS ABS: 0.5 10*3/uL — AB (ref 0.7–4.0)
Lymphocytes Relative: 3 % — ABNORMAL LOW (ref 12–46)
MCH: 30.4 pg (ref 26.0–34.0)
MCHC: 34.3 g/dL (ref 30.0–36.0)
MCV: 88.8 fL (ref 78.0–100.0)
MONOS PCT: 5 % (ref 3–12)
Monocytes Absolute: 0.9 10*3/uL (ref 0.1–1.0)
Neutro Abs: 16.2 10*3/uL — ABNORMAL HIGH (ref 1.7–7.7)
Neutrophils Relative %: 90 % — ABNORMAL HIGH (ref 43–77)
PLATELETS: 362 10*3/uL (ref 150–400)
RBC: 3.65 MIL/uL — AB (ref 4.22–5.81)
RDW: 16.5 % — ABNORMAL HIGH (ref 11.5–15.5)
WBC: 18 10*3/uL — ABNORMAL HIGH (ref 4.0–10.5)

## 2014-03-09 LAB — COMPREHENSIVE METABOLIC PANEL
ALT: 1478 U/L — ABNORMAL HIGH (ref 0–53)
AST: 1460 U/L — ABNORMAL HIGH (ref 0–37)
Albumin: 2 g/dL — ABNORMAL LOW (ref 3.5–5.2)
Alkaline Phosphatase: 232 U/L — ABNORMAL HIGH (ref 39–117)
Anion gap: 22 — ABNORMAL HIGH (ref 5–15)
BILIRUBIN TOTAL: 0.9 mg/dL (ref 0.3–1.2)
BUN: 81 mg/dL — ABNORMAL HIGH (ref 6–23)
CHLORIDE: 110 meq/L (ref 96–112)
CO2: 15 meq/L — AB (ref 19–32)
Calcium: 5.6 mg/dL — CL (ref 8.4–10.5)
Creatinine, Ser: 2.65 mg/dL — ABNORMAL HIGH (ref 0.50–1.35)
GFR, EST AFRICAN AMERICAN: 24 mL/min — AB (ref >90)
GFR, EST NON AFRICAN AMERICAN: 21 mL/min — AB (ref >90)
Glucose, Bld: 110 mg/dL — ABNORMAL HIGH (ref 70–99)
Potassium: 4.3 mEq/L (ref 3.7–5.3)
Sodium: 147 mEq/L (ref 137–147)
Total Protein: 5.1 g/dL — ABNORMAL LOW (ref 6.0–8.3)

## 2014-03-09 SURGERY — PERICARDIAL TAP
Anesthesia: LOCAL

## 2014-03-09 MED ORDER — PANTOPRAZOLE SODIUM 40 MG IV SOLR
40.0000 mg | INTRAVENOUS | Status: DC
  Administered 2014-03-09: 40 mg via INTRAVENOUS
  Filled 2014-03-09 (×2): qty 40

## 2014-03-09 MED ORDER — BISACODYL 10 MG RE SUPP: 10.0000 mg | Freq: Every day | RECTAL | Status: DC | PRN

## 2014-03-09 MED ORDER — MORPHINE SULFATE 2 MG/ML IJ SOLN
1.0000 mg | INTRAMUSCULAR | Status: DC | PRN
  Administered 2014-03-09: 1 mg via INTRAVENOUS
  Filled 2014-03-09: qty 1

## 2014-03-09 MED ORDER — DEXAMETHASONE SODIUM PHOSPHATE 4 MG/ML IJ SOLN
4.0000 mg | Freq: Two times a day (BID) | INTRAMUSCULAR | Status: DC
  Administered 2014-03-09: 4 mg via INTRAVENOUS
  Filled 2014-03-09 (×3): qty 1

## 2014-03-09 MED ORDER — FOLIC ACID 5 MG/ML IJ SOLN
1.0000 mg | Freq: Every day | INTRAMUSCULAR | Status: DC
  Filled 2014-03-09 (×2): qty 0.2

## 2014-03-09 MED ORDER — ENSURE COMPLETE PO LIQD
237.0000 mL | Freq: Two times a day (BID) | ORAL | Status: DC
Start: 2014-03-09 — End: 2014-03-10
  Administered 2014-03-09: 237 mL via ORAL

## 2014-03-09 MED ORDER — LEVOTHYROXINE SODIUM 100 MCG IV SOLR
75.0000 ug | Freq: Every day | INTRAVENOUS | Status: DC
  Filled 2014-03-09 (×2): qty 5

## 2014-03-09 NOTE — Progress Notes (Addendum)
Subjective:  Had pericardiocentesis done yesterday with tube placed. Total output around 700cc from there.. BP remained 80-90's, with 2 x516ml boluses.  Remains on 5L o2 [place].   Subjectively denies any SOB, cp, n/v, fever, chills. Initially denied any pain but later said he has body pain everywhere. Family and patient desires comfort measures and wants to talk to palliative about hospice.   Objective: Vital signs in last 24 hours: Filed Vitals:   02/15/2014 0540 03/04/2014 0800 02/24/2014 0847 03/02/2014 1200  BP: 107/54 94/35  85/63  Pulse:  94 74 79  Temp:  97.5 F (36.4 C)  97.7 F (36.5 C)  TempSrc:  Oral  Oral  Resp: 17 17 17 16   Height:      Weight:      SpO2:  93% 99% 100%   Weight change: 3.104 kg (6 lb 13.5 oz)  Intake/Output Summary (Last 24 hours) at 03/01/2014 1248 Last data filed at 03/14/2014 0800  Gross per 24 hour  Intake   3350 ml  Output    310 ml  Net   3040 ml   Vitals reviewed. General: resting in bed, appears to be in distress HEENT: PERRL, EOMI, no scleral icterus. Tongue appears to have aphthous ulcers posteriorly and anteriorly there is some blood. Cardiac: RRR, no rubs, murmurs or gallops Pulm: has diffuse exp wheezing and crackles bilaterally. However, he denies subjective SOB. Lymph: there is a superficial, mobile, hard, ~1cm rounded lump on his anterior cervical chain area. Abd: soft, nontender, nondistended, BS present Ext: warm and well perfused, no pedal edema Neuro: alert and oriented X3, cranial nerves II-XII grossly intact, strength and sensation to light touch equal in bilateral upper and lower extremities  Lab Results: Basic Metabolic Panel:  Recent Labs Lab 02/19/2014 1251 03/06/2014 0315 02/25/2014 1030  NA 143 143 147  K 5.1 5.1 4.3  CL 100 102 110  CO2 17* 16* 15*  GLUCOSE 193* 173* 110*  BUN 77* 79* 81*  CREATININE 1.88* 2.26* 2.65*  CALCIUM 6.8* 6.5* 5.6*  MG  --  1.9  --    Liver Function Tests:  Recent Labs Lab 02/20/2014 1902  03/16/2014 1030  AST 617* 1460*  ALT 528* 1478*  ALKPHOS 250* 232*  BILITOT 1.3* 0.9  PROT 6.2 5.1*  ALBUMIN 2.5* 2.0*   No results found for this basename: LIPASE, AMYLASE,  in the last 168 hours No results found for this basename: AMMONIA,  in the last 168 hours CBC:  Recent Labs Lab 03/02/2014 0315 03/12/2014 1030  WBC 20.5* 18.0*  NEUTROABS  --  16.2*  HGB 11.0* 11.1*  HCT 33.4* 32.4*  MCV 88.8 88.8  PLT 437* 362   Cardiac Enzymes:  Recent Labs Lab 02/26/2014 1251 02/27/2014 1902 03/13/2014 0315  TROPONINI <0.30 <0.30 <0.30    Recent Labs Lab 02/22/2014 1251  PROBNP 2829.0*  CBG:  Recent Labs Lab 02/27/2014 1217  GLUCAP 128*  Thyroid Function Tests:  Recent Labs Lab 03/14/2014 1902  TSH 64.350*    Micro Results: Recent Results (from the past 240 hour(s))  CULTURE, BLOOD (ROUTINE X 2)     Status: None   Collection Time    03/06/2014  2:28 PM      Result Value Ref Range Status   Specimen Description BLOOD RIGHT HAND   Final   Special Requests BOTTLES DRAWN AEROBIC ONLY 3 CC   Final   Culture  Setup Time     Final   Value: 02/23/2014 00:10  Performed at [facility]     Final   Value:        BLOOD CULTURE RECEIVED NO GROWTH TO DATE CULTURE WILL BE HELD FOR 5 DAYS BEFORE ISSUING A FINAL NEGATIVE REPORT     Performed at [facility]   Report Status PENDING   Incomplete  CULTURE, BLOOD (ROUTINE X 2)     Status: None   Collection Time    02/14/2014  3:30 PM      Result Value Ref Range Status   Specimen Description BLOOD RIGHT HAND   Final   Special Requests BOTTLES DRAWN AEROBIC ONLY 10CC   Final   Culture  Setup Time     Final   Value: 02/18/2014 00:09     Performed at [facility]   Culture     Final   Value:        BLOOD CULTURE RECEIVED NO GROWTH TO DATE CULTURE WILL BE HELD FOR 5 DAYS BEFORE ISSUING A FINAL NEGATIVE REPORT     Performed at [facility]   Report Status PENDING   Incomplete  BODY FLUID CULTURE      Status: None   Collection Time    03/13/2014 12:20 PM      Result Value Ref Range Status   Specimen Description FLUID PERICARDIAL   Final   Special Requests NONE   Final   Gram Stain     Final   Value: FEW WBC PRESENT,BOTH PMN AND MONONUCLEAR     NO ORGANISMS SEEN     Performed at [facility]     Performed at [facility]   Culture     Final   Value: NO GROWTH 1 DAY     Performed at [facility]   Report Status PENDING   Incomplete  GRAM STAIN     Status: None   Collection Time    02/15/2014 12:20 PM      Result Value Ref Range Status   Specimen Description FLUID PERICARDIAL   Final   Special Requests NONE   Final   Gram Stain     Final   Value: FEW WBC PRESENT,BOTH PMN AND MONONUCLEAR     NO ORGANISMS SEEN   Report Status 02/24/2014 FINAL   Final   Studies/Results: Dg Chest 2 View  03/12/2014   CLINICAL DATA:  One-week history of weakness and shortness of breath with decreased appetite ; history of diabetes  EXAM: CHEST  2 VIEW  COMPARISON:  Portable chest x-ray of March 22, 2012  FINDINGS: There is new abnormal increased interstitial density posteriorly in the right upper lobe. Elsewhere the right lung is clear as is the left lung. There is no pleural effusion. The mediastinum is not shifted. The heart is top-normal in size. The pulmonary vascularity is not engorged. The bony thorax is unremarkable.  IMPRESSION: New increased density in the right upper lobe posteriorly is most compatible with pneumonia. Follow-up chest film films following therapy are recommended to assure clearing. Chest CT scanning may be ultimately indicated if the findings/ clinical symptoms are unresponsive to therapy.   Electronically Signed   By: David  Martinique   On: 03/13/2014 13:38   Ct Chest Wo Contrast     CLINICAL DATA:  Shortness of breath.  EXAM: CT CHEST WITHOUT CONTRAST  TECHNIQUE: Multidetector CT imaging of the chest was performed following the standard protocol  without IV contrast.  COMPARISON:  Chest radiograph of  same day.  FINDINGS: No pneumothorax or significant pleural effusion is noted. 8 mm nodule is noted in lingula best seen on image number 34 series 3. Large airspace opacity is noted posteriorly in the right upper lobe most consistent with pneumonia. There appears to be some degree of necrosis present within this suggesting developing abscess. 21 x 17 mm rounded density is seen inferior to this which may represent focal inflammation, but is concerning for possible neoplasm giving its well-defined appearance. Mild airspace opacity is seen medially in the right lower lobe consistent with focal pneumonia.  No significant osseous abnormality is noted. Moderate pericardial effusion is noted. Atherosclerotic calcifications of thoracic aorta are noted without aneurysm formation. 31 x 22 mm left adrenal mass is noted.  IMPRESSION: Large right upper lobe pneumonia is noted, with air present within it suggesting parenchymal necrosis and possible developing abscess. Smaller airspace opacity is seen medially in the right lower lobe also concerning for pneumonia.  21 x 17 mm rounded density seen more inferiorly in the right upper lobe which is concerning for possible neoplasm or malignancy. Followup CT is recommended in 2-3 weeks to see if this represents inflammation; if it is persistent, further evaluation with PET scan or biopsy is recommended.  3.1 cm left adrenal mass is noted. Further evaluation with MRI is recommended to evaluate for possible neoplasm.  Moderate pericardial effusion is noted.  8 mm left lingular nodule is noted. If the abnormality in the right upper lobe is determined to be malignant, this would be consistent with metastatic disease. Otherwise, if the patient is at high risk for bronchogenic carcinoma, follow-up chest CT at 3-59months is recommended. If the patient is at low risk for bronchogenic carcinoma, follow-up chest CT at 6-12 months is  recommended. This recommendation follows the consensus statement: Guidelines for Management of Small Pulmonary Nodules Detected on CT Scans: A Statement from the [place] as published in Radiology 2005; 237:395-400.   Electronically Signed   By: Sabino Dick M.D.   On: 02/25/2014 16:11   Medications: I have reviewed the patient's current medications. Scheduled Meds: . aspirin EC  81 mg Oral Daily  . feeding supplement (ENSURE COMPLETE)  237 mL Oral BID BM  . folic acid  1 mg Oral Daily  . heparin  5,000 Units Subcutaneous 3 times per day  . latanoprost  1 drop Both Eyes QHS  . levalbuterol  0.63 mg Nebulization Q6H  . levothyroxine  137 mcg Oral QAC breakfast  . multivitamin with minerals  1 tablet Oral Daily  . piperacillin-tazobactam (ZOSYN)  IV  3.375 g Intravenous 3 times per day  . sodium chloride  3 mL Intravenous Q12H  . thiamine  100 mg Oral Daily   Or  . thiamine  100 mg Intravenous Daily  . [START ON Apr 06, 2014] vancomycin  1,000 mg Intravenous Q48H   Continuous Infusions: . sodium chloride 125 mL/hr at 02/22/2014 0526  . norepinephrine (LEVOPHED) Adult infusion     PRN Meds:.antiseptic oral rinse, ipratropium, LORazepam, LORazepam, morphine injection, oxyCODONE  80 yo male with smoker, alcohol abuser, hx of remote BCA, PCA, DM, HTN, who presents with fatigue and SOB for last 2 weeks, found to have PNA and lung nodules on CXR and chest CT, and pleural effusion+RV mass on echo.    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 Also has RV mass.  - he had pericardiocentesis draining about 700 cc blood. Cytology pending. - BP improved initially after pericardiocentesis but then remained low despite IV boluses. Crt continues to go up likely from hypoperfusion to kidneys- will continue to  support with IVF bolus and continuous fluids. - Patient was initially unsure but he decided to to become DNR/DNI -pressors ok, no shock.   - f/up sputum cytology.  - f/up fluid analysis from pericardiocentesis.   Severe sepsis likely from postobstructive PNA and pulmonary abscess formation - CXR shows RUL posterior subsegment post-obstructive PNA which is confirmed by CT chest. There is some abscess formation and necrotic tissue in this area with upstream obstruction.  He is currently in vanc+zosyn for Post obs PNA but WBC is going up. Abscess needs to be drained but there is high risk of pneumothorax if it is done with CT guided drainage. It is not next to the airway where bronch would be helpful to drain it. Even if it's drained, there is high likelyhood of reaccumulation since there is obstruction upstream.  XRT might be the only option but that would be inconsistent with comfort care.  - it is hard to know the source since broncoscopic biopsy would be difficulty based on the location of the nodule/PNA as seen on chest CT. Will f/up fluid analysis from pericardiocentesis for the source.  - f/up sputum cytology -cont vanc+zosyn even though it will not treat the abscess - scheduled albuterol q4hr because of increased WOB, wheezing.  amylase 7, LDH 3328, protein 4.2, crt 2.6, glucose 119, pH 8.5 WBC 3532, monocyte % 49%, neutrophil 15%. lypmh 36%,  no organism on gram stain, NGTD. protein fluid/serum = 0.67. exudate. Could be viral?    Acute Renal failure - likely 2/2 pre-renal from sepsis/tamponade.  - cont IVF's.  Afib- likely due to pericardial effusion or sepsis. -treat the cause.  Hypothyroidism - on home synthyroid  DM II- hold home metformin  PPX: heparin Diet:carb mod  Code: DNR/DNI, pressors are ok.  Dispo: there is a high chance he may pass shortly from his current medical condition. His family and the patient is aware of this. Will discuss hospice for him later on.    The patient does have a current PCP Olevia Bowens, MD) and does need an [facility] hospital follow-up appointment after discharge.  The patient does have transportation limitations that hinder transportation to clinic appointments.  .Services Needed at time of discharge: Y = Yes, Blank = No PT:   OT:   RN:   Equipment:   Other:     LOS: 2 days   Dellia Nims, MD 02/24/2014, 12:48 PM

## 2014-03-09 NOTE — Progress Notes (Signed)
PT Cancellation Note  Patient Details Name: Gilman Olazabal MRN: [id number] DOB: 04/30/1930   Cancelled Treatment:    Reason Eval/Treat Not Completed: Noted pt and family now considering full comfort care and palliative care/Hospice has been consulted. Will await Palliative care meeting with pt/family to determine if PT is indicated/desired. Will follow and proceed with PT evaluation if appropriate.     Cordie Beazley 02/23/2014, 2:38 PM Pager [phone]

## 2014-03-09 NOTE — Progress Notes (Signed)
Visited with patient to provide support to both patient and family.  Patient son-in-law at bedside.  Family had requested that a Chaplain visit and pray with patient.  Family is very supportive and presence each day.  Patient and family pastors are involved with patient emotional and spiritual care.  Per son- in-law plans are being made for patient to go to hospice.  Chaplain prayed with patient and son- in -law Gwen Pounds) and offered word of encouragement and empathetic listening and ministry of presence.  Will follow as needed.  02/17/2014 1600  Clinical Encounter Type  Visited With Patient and family together;Health care provider  Visit Type Spiritual support;Trauma  Referral From Nurse;Physician  Spiritual Encounters  Spiritual Needs Prayer;Emotional  Stress Factors  Family Stress Factors None identified  Jaclynn Major, [place]

## 2014-03-09 NOTE — Consult Note (Signed)
Patient Mike Zuniga      DOB: March 12, 1930      EXB:[id number]     Consult Note from the Palliative Medicine Team at [facility] Requested by: Dr. Genene Churn     PCP: Olevia Bowens, MD Reason for Consultation: [place] and hospice options.  Phone Number:None  Assessment of patients Current state: I met today with Mike Zuniga (who was fairly lethargic and not able to participate) and his daughters Michelene Heady and Kathaleen Grinder, and Bowonna's daughter Ermalinda Barrios and her significant other. They tell me that they understand that he has extensive cancer and have noticed his decline over the past especially month with extensive weight loss, decreased appetite, and decreased mobility. He never mentioned to them that he had any health problems. 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 They say he has had a good life and enjoyed his retirement. We talked about how this changes are very threatening to his independence. Family all agree that we focus on him being comfortable and want whatever time he has left to be pleasant and peaceful as can be. They do not want him in pain or to suffer. We talked about expectations in the coming days with sleeping more, eating less, interacting less and less - they understand. We discussed where this care would take place and they tell me that they had an uncle at [facility] earlier this year and were pleased with the care. We talked about comfort care and what this means - they understand that hospice facility will not be continuing antibiotics, pericardial drain, IV fluids, cardiac monitor and they say this is okay with them. However, while still hospitalized they wish to continue above treatments as needed. Discussed side effect of hypotension with liberalization of pain medication and we agree this is as normal effect of the body shutting down and they  understand. Will make complete DNR.   They tell me he has been saying "I hurt all over" and has been complaining of "gout pain" in his feet. He did awaken during our conversation and said "I'm ready to go." We took this comment as very telling as family says they believe he is ready to die. I will continue to follow and support.    Goals of Care: 1.  Code Status: DNR   2. Scope of Treatment: 1. Vital Signs: continue.  2. Respiratory/Oxygen: for comfort 3. Nutritional Support/Tube Feeds: no 4. Antibiotics: continue while in hospital.  5. Telemetry: continue while in hospital.    4. Disposition: Hopeful for hospice facility.    3. Symptom Management:   1. Anxiety/Agitation: Lorazepam 1 mg every 6 hours prn CIWA. 2. Pain: Morphine 1 mg IV every hour prn. Oxycodone IR 5 mg every 3 hours prn. 3. Gout Pain in feet: Decadron 4 mg IV BID. Concerned he will likely be unable to take po soon - very lethargic.  4. Bowel Regimen: Dulcolax supp daily prn.   4. Psychosocial: Emotional support provided to patient and family.   5. Spiritual: Family says that he can be very spiritual - they said "there is no doubt he is going to heaven." Chaplain consulted.     Brief HPI: 80 yo male admitted with weight loss and failure to thrive. Echo showed a large pericardial effusion without tamponade (requiring pericardial drain) and intramuscular mass in RV. CT chest showed evidence of likely malignancy with post-obstructive pneumonia  changes and also suspicious for metastatic disease to adrenals. PMH significant for smoking, alcohol abuse, breast cancer, prostate cancer, diabetes mellitus, hypertension.    ROS: Unable to assess - too lethargic.     PMH:  Past Medical History  Diagnosis Date  . Hypertension   . Spinal stenosis   . Thyroid disease   . Diabetes mellitus   . Cataract   . Weakness   . Fatigue   . Lack of coordination   . Stroke   . Gout   . Prostate cancer     followed at  [facility]  . Breast cancer     reports cancer with L breast excision 60 years ago, he has no further details     PSH: Past Surgical History  Procedure Laterality Date  . X-stop implantation    . Back surgery    . Breast lumpectomy Left     excision of cancer 60 years ago, patient has no details   I have reviewed the [place] and SH and  If appropriate update it with new information. No Known Allergies Scheduled Meds: . aspirin EC  81 mg Oral Daily  . feeding supplement (ENSURE COMPLETE)  237 mL Oral BID BM  . folic acid  1 mg Oral Daily  . heparin  5,000 Units Subcutaneous 3 times per day  . latanoprost  1 drop Both Eyes QHS  . levalbuterol  0.63 mg Nebulization Q6H  . levothyroxine  137 mcg Oral QAC breakfast  . multivitamin with minerals  1 tablet Oral Daily  . piperacillin-tazobactam (ZOSYN)  IV  3.375 g Intravenous 3 times per day  . sodium chloride  3 mL Intravenous Q12H  . thiamine  100 mg Oral Daily   Or  . thiamine  100 mg Intravenous Daily  . [START ON 03-12-14] vancomycin  1,000 mg Intravenous Q48H   Continuous Infusions: . sodium chloride 125 mL/hr at 02/14/2014 0526  . norepinephrine (LEVOPHED) Adult infusion     PRN Meds:.antiseptic oral rinse, ipratropium, LORazepam, LORazepam, morphine injection, oxyCODONE    BP 85/63  Pulse 79  Temp(Src) 97.7 F (36.5 C) (Oral)  Resp 16  Ht $R'5\' 10"'qp$  (1.778 m)  Wt 65.7 kg (144 lb 13.5 oz)  BMI 20.78 kg/m2  SpO2 100%   PPS: 20%    Intake/Output Summary (Last 24 hours) at 03/07/2014 1448 Last data filed at 02/14/2014 0800  Gross per 24 hour  Intake   2850 ml  Output    230 ml  Net   2620 ml   LBM: 03/03/2014                         Physical Exam:  General: NAD, grimaces occasionally, lethargic HEENT: Temporal muscle wasting, ulcers in mouth (slight bleeding) Chest: No labored breathing, symmetric CVS: RRR, S1 S2 Abdomen: Soft, NT, ND Ext: MAE, no edema, warm to touch Neuro: Lethargic, awakens to voice, oriented to  person/place/situation but occasionally speaks inappropriate comments  Labs: CBC    Component Value Date/Time   WBC 18.0* 03/08/2014 1030   RBC 3.65* 02/20/2014 1030   HGB 11.1* 02/14/2014 1030   HCT 32.4* 02/15/2014 1030   PLT 362 03/07/2014 1030   MCV 88.8 02/21/2014 1030   MCH 30.4 02/25/2014 1030   MCHC 34.3 02/21/2014 1030   RDW 16.5* 02/20/2014 1030   LYMPHSABS 0.5* 02/14/2014 1030   MONOABS 0.9 03/12/2014 1030   EOSABS 0.4 02/25/2014 1030   BASOSABS 0.0 03/09/2014 1030  BMET    Component Value Date/Time   NA 147 02/28/2014 1030   K 4.3 02/27/2014 1030   CL 110 02/19/2014 1030   CO2 15* 02/27/2014 1030   GLUCOSE 110* 02/20/2014 1030   BUN 81* 02/17/2014 1030   CREATININE 2.65* 03/13/2014 1030   CALCIUM 5.6* 02/16/2014 1030   GFRNONAA 21* 02/22/2014 1030   GFRAA 24* 02/21/2014 1030    CMP     Component Value Date/Time   NA 147 03/16/2014 1030   K 4.3 02/19/2014 1030   CL 110 02/26/2014 1030   CO2 15* 02/19/2014 1030   GLUCOSE 110* 03/15/2014 1030   BUN 81* 03/05/2014 1030   CREATININE 2.65* 02/22/2014 1030   CALCIUM 5.6* 02/21/2014 1030   PROT 5.1* 02/22/2014 1030   ALBUMIN 2.0* 03/13/2014 1030   AST 1460* 03/14/2014 1030   ALT 1478* 03/05/2014 1030   ALKPHOS 232* 02/21/2014 1030   BILITOT 0.9 02/26/2014 1030   GFRNONAA 21* 02/26/2014 1030   GFRAA 24* 03/06/2014 1030     Time In Time Out Total Time Spent with Patient Total Overall Time  1400 1515 26mn 778m    Greater than 50%  of this time was spent counseling and coordinating care related to the above assessment and plan.   AlVinie SillNP Palliative Medicine Team Pager # [phone]-F 8a-5p) Team Phone # [phone]/Weekends)

## 2014-03-09 NOTE — Progress Notes (Signed)
   I saw the patient this AM & had a good talk with the daughters.  He actually woke up for me this AM & asked what the plan was. Repeat Echo shows complete drainage of pericardial effusion.  At this point, he is relatively stable from a cardiac standpoint & with the indication being going forward with Palliative Care, [facility] will sign off for now.  Will be available.    Would defer decision re re-look Echo (~2-4 weeks post tap) to Primary Service depending on goals of care.  Would not be surprised to see re-acculmulation of fluid with RV mass.  Thanks,  Leonie Man, M.D., M.S. Interventional Cardiologist   Pager # [phone] 02/19/2014

## 2014-03-09 NOTE — Progress Notes (Signed)
  Date: 03/06/2014  Patient name: Mike Zuniga  Medical record number: [id number]  Date of birth: 08-03-1929   This patient has been seen and the plan of care was discussed with the house staff. Please see their note for complete details. I concur with their findings with the following additions/corrections: Mike Zuniga c/o hurting all over. The nurse reported that the tip of his tongue was discoloured. We eval his tongue and he has several aphthous ulcers on base of tongue and purplish tip of tongue of unknown cause. Cr and LFT's are increasing. Pt's family was at bedside. All agree to palliative care consult which has been made. Small dose Morphine for pain if BP tolerates.    Bartholomew Crews, MD 03/16/2014, 4:29 PM

## 2014-03-09 NOTE — Progress Notes (Signed)
INITIAL NUTRITION ASSESSMENT  DOCUMENTATION CODES Per approved criteria  -Severe malnutrition in the context of chronic illness  Pt meets criteria for severe MALNUTRITION in the context of chronic illness as evidenced by reported intake <75% of estimated needs for >1 month and 18% wt loss in 6 months.  INTERVENTION: - Downgrade diet to Dysphagia I per family request.  - Ensure Complete po BID, each supplement provides 350 kcal and 13 grams of protein - RD will continue to monitor for nutrition care plan.  NUTRITION DIAGNOSIS: Inadequate oral intake related to FTT as evidenced by reported intake less than estimated needs and wt loss.   Goal: Pt to meet >/= 90% of their estimated nutrition needs   Monitor:  Weight trend, po intake, acceptance of supplements, labs  Reason for Assessment: MST  81 y.o. male  Admitting Dx: Sepsis  ASSESSMENT: 80 year old male with diabetes, remote breast cancer excision many years ago, and prostate cancer followed at [facility] who was admitted 8/22 with failure to thrive and weight loss.  - Pt family reports a 30 lb wt loss in less than 6 months. They say that his intake has been very poor for the past several months. Suspect that pt has trouble swallowing solid foods. Spoke with family who agreed that pt should be on pureed diet. MD recommends comfort measures vs aggressive care.   Labs:  Na and K WNL BUN elevated  Height: Ht Readings from Last 1 Encounters:  03/14/2014 5\' 10"  (1.778 m)    Weight: Wt Readings from Last 1 Encounters:  03/05/2014 144 lb 13.5 oz (65.7 kg)    Ideal Body Weight: 73 kg  % Ideal Body Weight: 90%  Wt Readings from Last 10 Encounters:  02/24/2014 144 lb 13.5 oz (65.7 kg)  03/06/2014 144 lb 13.5 oz (65.7 kg)  02/18/2014 144 lb 13.5 oz (65.7 kg)    Usual Body Weight: 170's  % Usual Body Weight: 84%  BMI:  Body mass index is 20.78 kg/(m^2).  Estimated Nutritional Needs: Kcal: 1800-2000 Protein: 90-100  g Fluid: 1.8-2.0 L/day  Skin: Stage I pressure ulcer on coccyx  Diet Order: Carb Control  EDUCATION NEEDS: -Education not appropriate at this time   Intake/Output Summary (Last 24 hours) at 02/15/2014 1057 Last data filed at 02/24/2014 0800  Gross per 24 hour  Intake   3850 ml  Output    310 ml  Net   3540 ml    Last BM: 8/24   Labs:   Recent Labs Lab 02/27/2014 1251 03/06/2014 0315  NA 143 143  K 5.1 5.1  CL 100 102  CO2 17* 16*  BUN 77* 79*  CREATININE 1.88* 2.26*  CALCIUM 6.8* 6.5*  MG  --  1.9  GLUCOSE 193* 173*    CBG (last 3)   Recent Labs  02/18/2014 1217  GLUCAP 128*    Scheduled Meds: . aspirin EC  81 mg Oral Daily  . folic acid  1 mg Oral Daily  . heparin  5,000 Units Subcutaneous 3 times per day  . latanoprost  1 drop Both Eyes QHS  . levalbuterol  0.63 mg Nebulization Q6H  . levothyroxine  137 mcg Oral QAC breakfast  . multivitamin with minerals  1 tablet Oral Daily  . piperacillin-tazobactam (ZOSYN)  IV  3.375 g Intravenous 3 times per day  . sodium chloride  3 mL Intravenous Q12H  . thiamine  100 mg Oral Daily   Or  . thiamine  100 mg  Intravenous Daily  . [START ON 04-02-2014] vancomycin  1,000 mg Intravenous Q48H    Continuous Infusions: . sodium chloride 125 mL/hr at 02/15/2014 0526  . norepinephrine (LEVOPHED) Adult infusion      Past Medical History  Diagnosis Date  . Hypertension   . Spinal stenosis   . Thyroid disease   . Diabetes mellitus   . Cataract   . Weakness   . Fatigue   . Lack of coordination   . Stroke   . Gout   . Prostate cancer     followed at [facility]  . Breast cancer     reports cancer with L breast excision 60 years ago, he has no further details    Past Surgical History  Procedure Laterality Date  . X-stop implantation    . Back surgery    . Breast lumpectomy Left     excision of cancer 60 years ago, patient has no details    Terrace Arabia RD, LDN

## 2014-03-09 NOTE — Progress Notes (Addendum)
Patient's GFR and kidney failure has been deteriorating, currently GFR 24. The patient also has findings of masses in multiple organs (lungs, liver, heart) and palliative care has been consulted. For now we will place cardiac MRI on hold. If GFR > 30 and still clinically relevant we ill be happy to proceed with MRI on anyday.  Mike Zuniga 03/03/2014

## 2014-03-09 NOTE — Progress Notes (Signed)
OT Cancellation Note  Patient Details Name: Mike Zuniga MRN: [id number] DOB: 1929/11/29   Cancelled Treatment:    Reason Eval/Treat Not Completed: Chart reviewed, and spoke with RN.  Pt and family now considering full comfort care and palliative care/Hospice has been consulted.  Will await Palliative care meeting with pt/family to determine if OT is needed.  Will check back.   Darlina Rumpf [place], OTR/L [phone]  02/23/2014, 2:00 PM

## 2014-03-09 NOTE — Progress Notes (Signed)
Bladder scanner done, approximately 350cc of urine noted, patient encouraged to void.  Windy Carina [place]

## 2014-03-11 LAB — BODY FLUID CULTURE: Culture: NO GROWTH

## 2014-03-14 LAB — CULTURE, BLOOD (ROUTINE X 2)
CULTURE: NO GROWTH
CULTURE: NO GROWTH

## 2014-03-17 NOTE — Progress Notes (Signed)
Chaplain received referral from overnight chaplain to offer support to family of actively dying patient. Chaplain consulted with RN who said patient had died and family was at bedside. Offered emotional support to family members, who were sad, but calm. Listened empathically to their stories and memories about patient. They repeated that they have the support of several faith communities and pastors. They requested prayer, which chaplain provided. Additional family and friends are on the way to the hospital. Family aware that chaplain is available for additional support. Please page.   Ethelene Browns [phone]

## 2014-03-17 NOTE — Progress Notes (Signed)
Pt without breath sounds, heart sounds, and pupillary response verified by 2 RN's. Family aware and at bedside.  [place] [facility] notified of time of deatth.

## 2014-03-17 NOTE — Progress Notes (Signed)
Informed patients daughter (POA), Durene Fruits, that pt is no longer responding and BP is now 56/32. Pt's daughter states she is on her way to hospital.  Loni Muse, RN

## 2014-03-17 NOTE — Consult Note (Signed)
I have reviewed and discussed case with Nurse Practitioner And agree with documentation and plan as noted above   Valbona Slabach J. Jaecob Lowden D.O.  Palliative Medicine Team at [place]  Team Phone: [phone]    

## 2014-03-17 NOTE — Discharge Summary (Signed)
  Name: Mike Zuniga MRN: [id number] DOB: [birth year] 80 y.o.  Date of Admission: 03/15/2014 12:21 PM Date of Discharge: 30-Mar-2014 Attending Physician: No att. providers found  Discharge Diagnosis: Principal Problem:   Sepsis Active Problems:   Postobstructive pneumonia   DM2 (diabetes mellitus, type 2)   Spinal stenosis, unspecified region other than cervical   HTN (hypertension)   Hypothyroid   Pericardial effusion   BPH (benign prostatic hyperplasia)   History of breast cancer   Pericardial effusion with cardiac tamponade   Cardiac malignancy   Acute renal failure   Protein-calorie malnutrition, severe   Palliative care encounter   Cause of death: Respiratory distress, septic shock Time of death: 8:30 AM Mar 30, 2014  Disposition and follow-up:   [name] was discharged from [facility] in expired condition.    Hospital Course:  80 yo male with smoker, alcohol abuser, hx of remote BCA, PCA, DM, HTN, who presents with fatigue and SOB for last 2 weeks, found to have PNA and lung nodules on CXR and chest CT, and pleural effusion+RV mass on echo.   Severe sepsis likely from postobstructive PNA and pulmonary abscess formation - CXR shows RUL posterior subsegment post-obstructive PNA which is confirmed by CT chest. There is some abscess formation and necrotic tissue in this area with upstream obstruction.  He is currently in vanc+zosyn for Post obs PNA but WBC is going up. Abscess needs to be drained but there is high risk of pneumothorax if it is done with CT guided drainage. It is not next to the airway where bronch would be helpful to drain it. Even if it's drained, there is high likelyhood of reaccumulation since there is obstruction upstream.   - it is hard to know the source since broncoscopic biopsy would be difficulty based on the location of the nodule/PNA as seen on chest CT. Obtained pericardial fluid for analysis.  -we treated him with vanc+zosyn  even though it will not treat the abscess. He and family was aware of this. We consulted palliative care and he was made comfortable with agreement with patient and family.   - scheduled albuterol q4hr because of increased WOB, wheezing.  - patient deceased from likely Respiratory failure from post obstructive PNA likely from malignancy.   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 Also has RV mass.  - he had pericardiocentesis draining about 700 cc blood. Full analysis pending but meets criteria for exudative process with high WBC count. - BP improved initially after pericardiocentesis but then remained low despite IV boluses. Crt continues to go up likely from hypoperfusion to kidneys even with IVF.   - Patient was initially unsure but he decided to to become DNR/DNI -pressors ok, no shock. He deceased with comfort measures.  AKI - likely 2/2 to sepsis/tamponade - continued IVF but crt continued to worsen.  Afib - likely due to pericardial effusion or sepsis so tried to treat the cause.   Signed: Dellia Nims, MD March 30, 2014, 2:04 PM

## 2014-03-17 NOTE — Progress Notes (Signed)
Was called by RN during rounds to let me know that patient has deceased. Family was notified and spiritual care was provided by chaplain.  We went to express condolence to the family later this morning with the attending Dr. Lynnae January.

## 2014-03-17 DEATH — deceased

## 2014-06-25 ENCOUNTER — Encounter (HOSPITAL_COMMUNITY): Payer: Self-pay | Admitting: Cardiology

## 2015-11-16 IMAGING — CR DG CHEST 2V
2 series · 2 of 2 positions shown · non-contrast
Comparison: Portable chest x-ray March 22, 2012

CLINICAL DATA: One-week history of weakness and shortness of breath
with decreased appetite ; history of diabetes

EXAM:
CHEST  2 VIEW

[w chest lat]
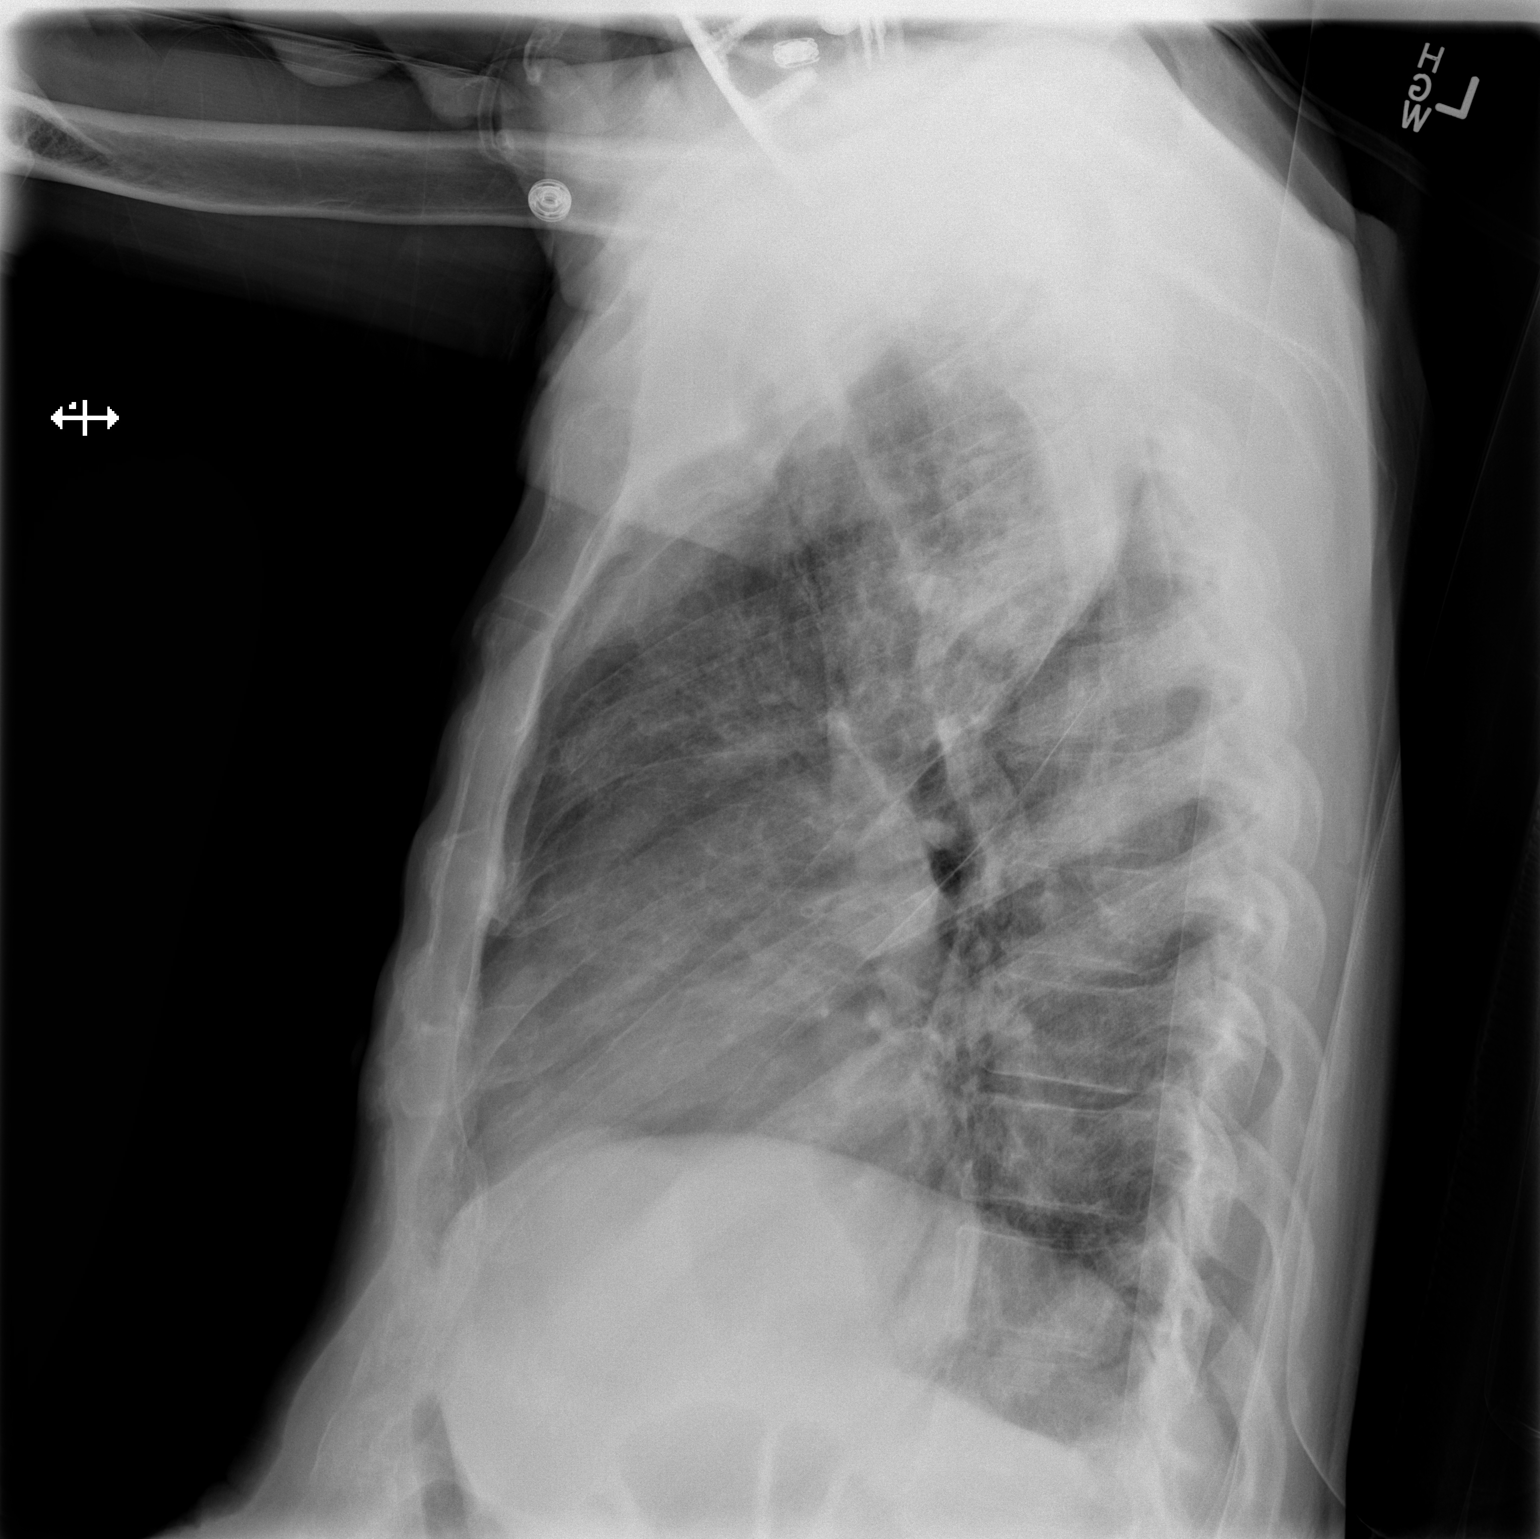

[x chest ap]
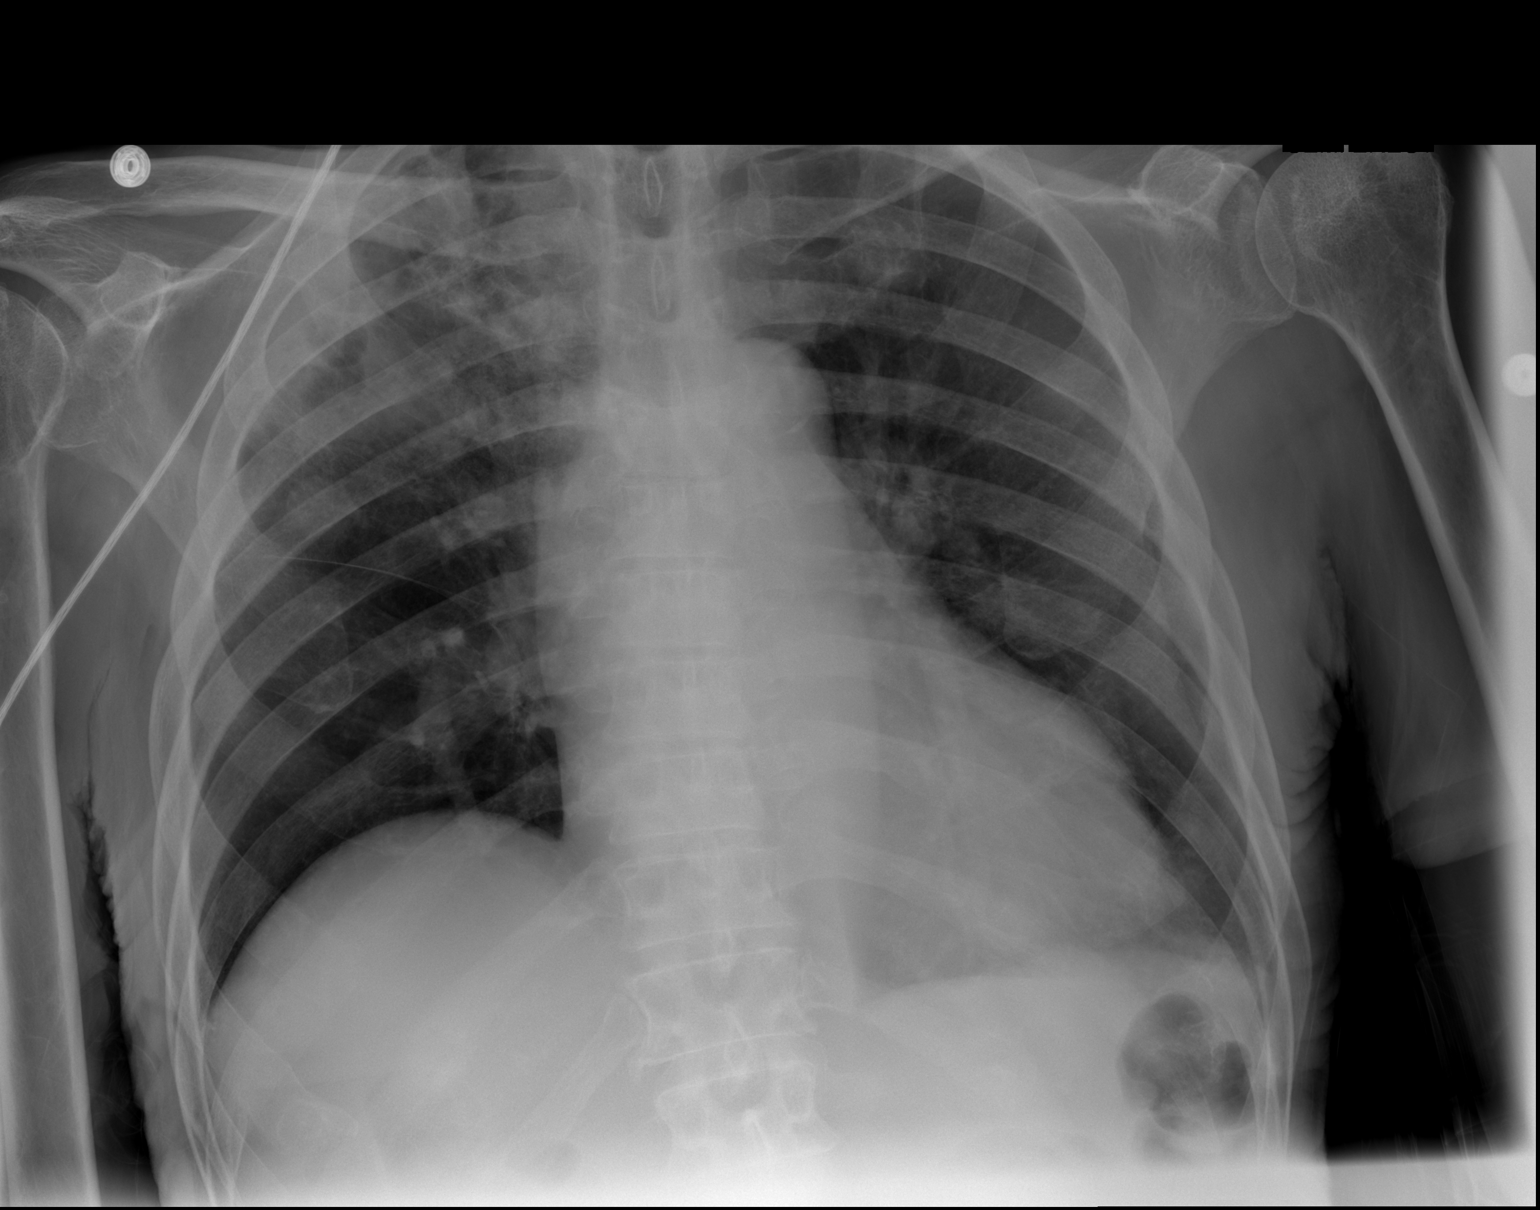

[2 of 2 positions shown; findings below may reference images not displayed]

FINDINGS: There is new abnormal increased interstitial density posteriorly in
the right upper lobe. Elsewhere the right lung is clear as is the
left lung. There is no pleural effusion. The mediastinum is not
shifted. The heart is top-normal in size. The pulmonary vascularity
is not engorged. The bony thorax is unremarkable.
IMPRESSION: New increased density in the right upper lobe posteriorly is most
compatible with pneumonia. Follow-up chest film films following
therapy are recommended to assure clearing. Chest CT scanning may be
ultimately indicated if the findings/ clinical symptoms are
unresponsive to therapy.

## 2015-11-16 IMAGING — CT CT CHEST W/O CM
2 of 3 series · 15 of 36 positions shown, 18 images · non-contrast
Comparison: Chest radiograph of same day.

CLINICAL DATA: Shortness of breath.

EXAM:
CT CHEST WITHOUT CONTRAST
TECHNIQUE: Multidetector CT imaging of the chest was performed following the
standard protocol without IV contrast..

[Series 2: thorax 5.0 i31f 1 · axial · 0.72mm/px · z∈[+1048,+1353]mm · 12 of 73 slices shown, 15 images]
[im 6/73  mediastinal]
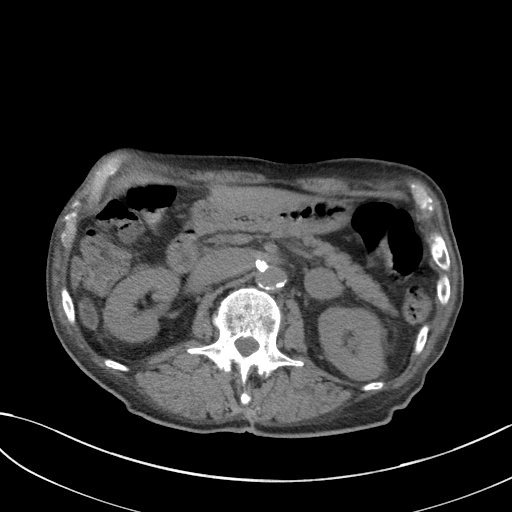
[im 6/73  lung]
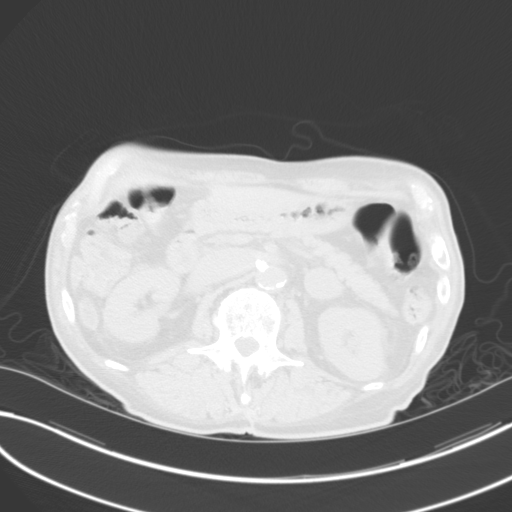
[im 11/73  lung]
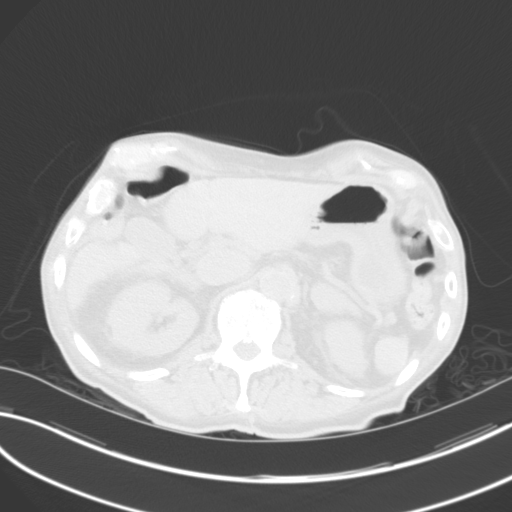
[im 17/73  lung]
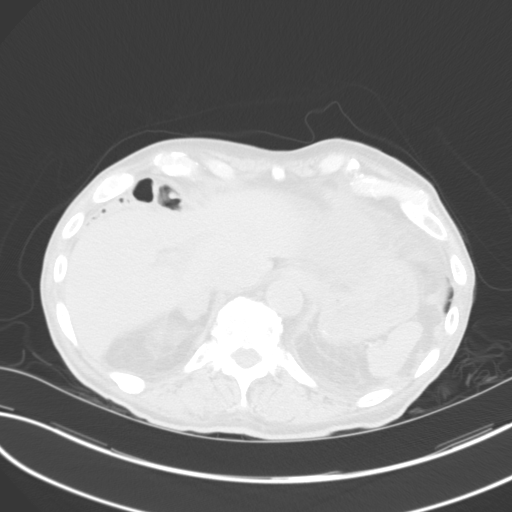
[im 22/73  lung]
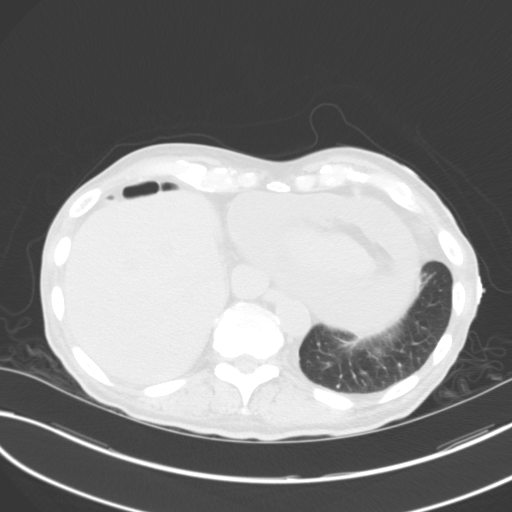
[im 27/73  mediastinal]
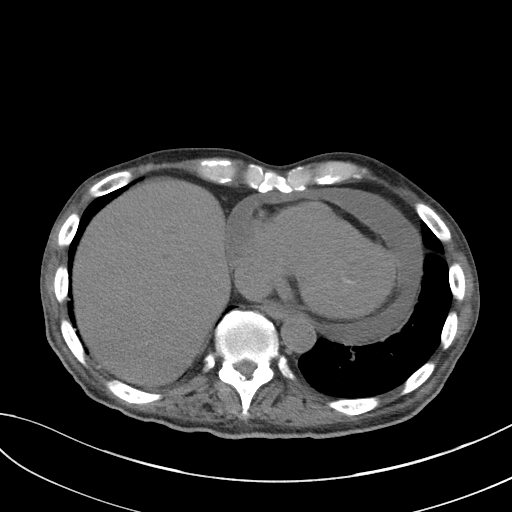
[im 27/73  lung]
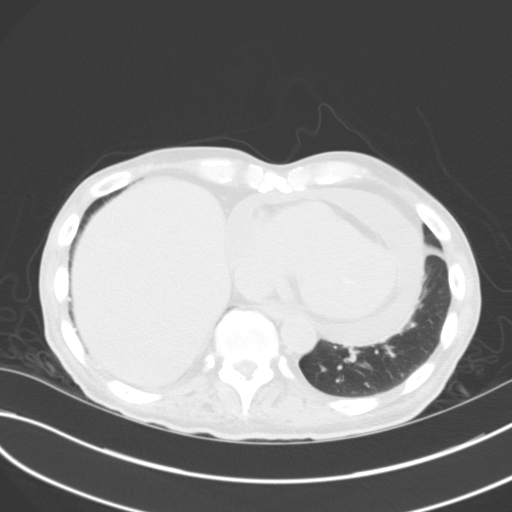
[im 33/73  lung]
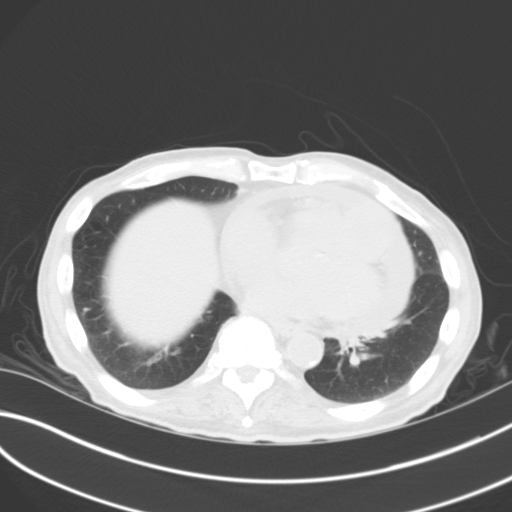
[im 41/73  lung]
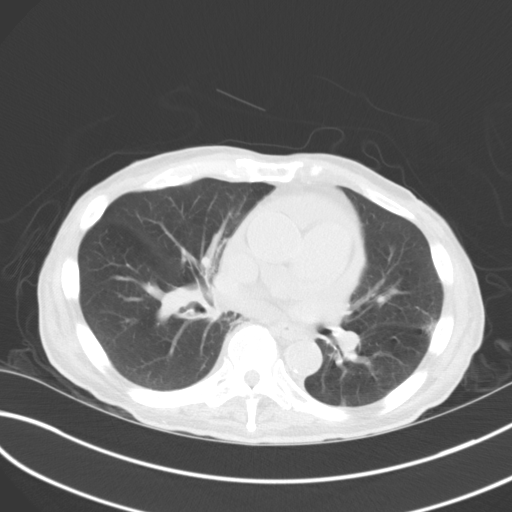
[im 46/73  lung]
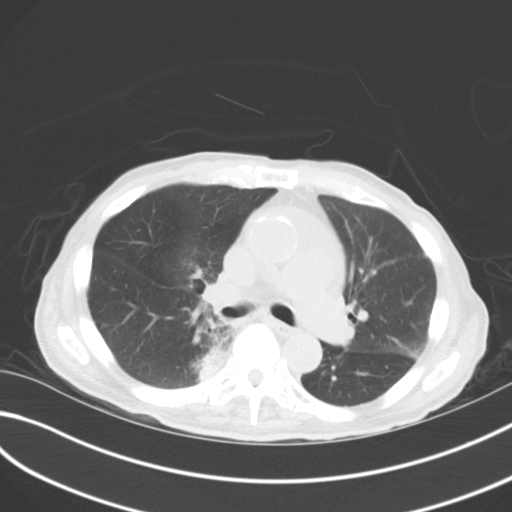
[im 51/73  mediastinal]
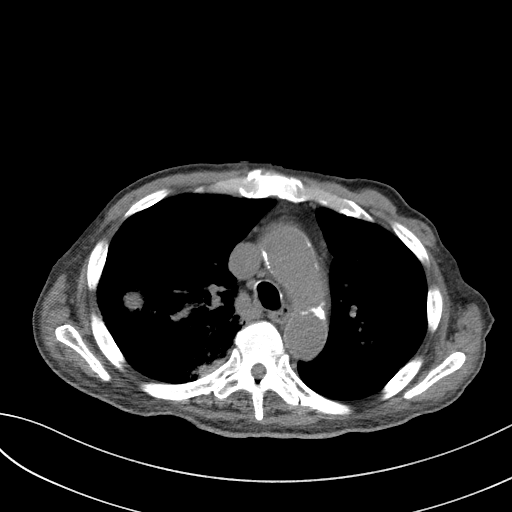
[im 51/73  lung]
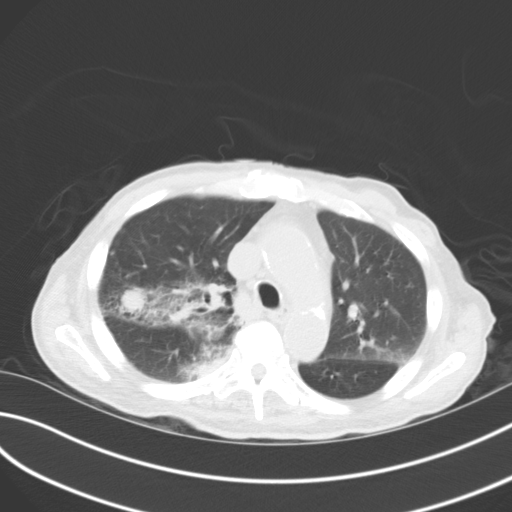
[im 57/73  lung]
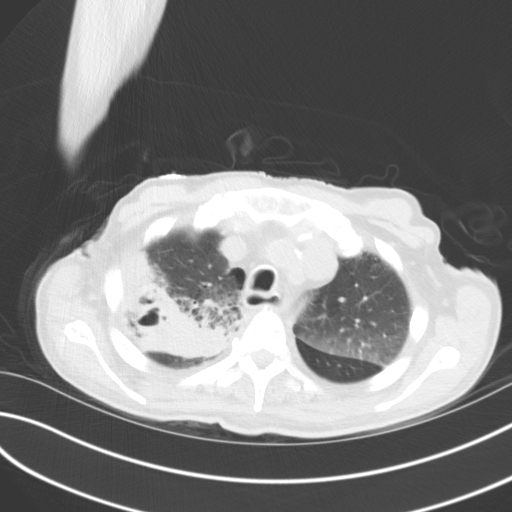
[im 62/73  lung]
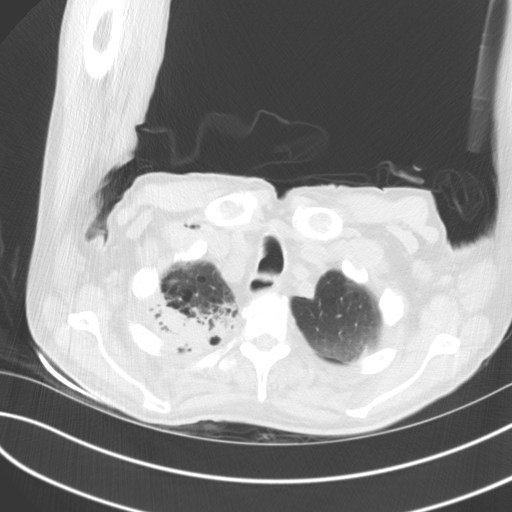
[im 67/73  lung]
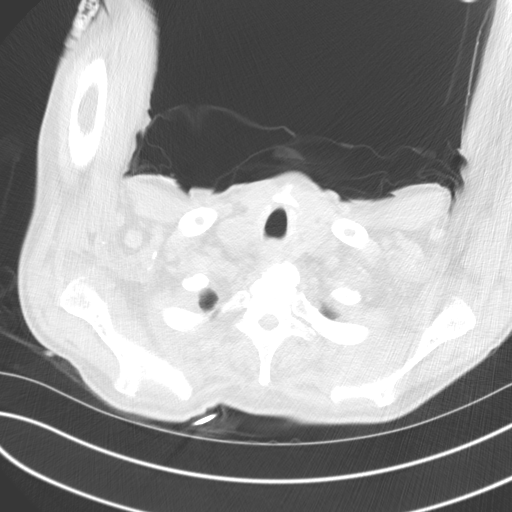

[Series 5: coronal · coronal · 0.71mm/px · 3 of 71 slices shown]
[im 15/71  lung]
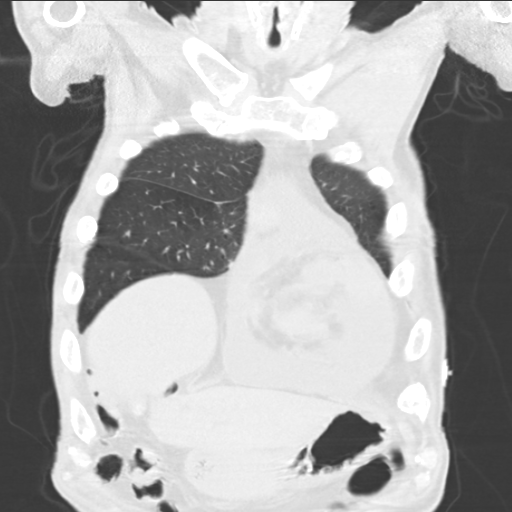
[im 29/71  lung]
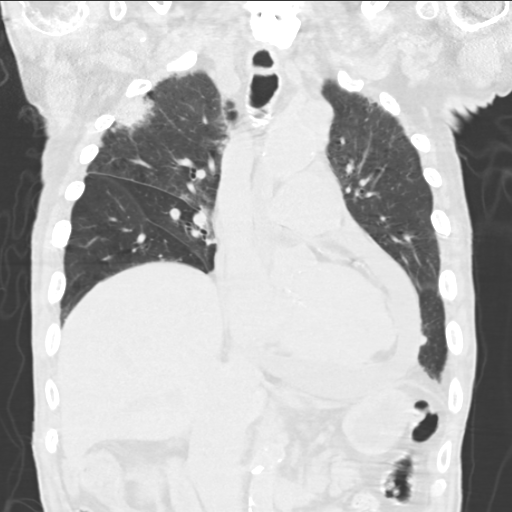
[im 43/71  lung]
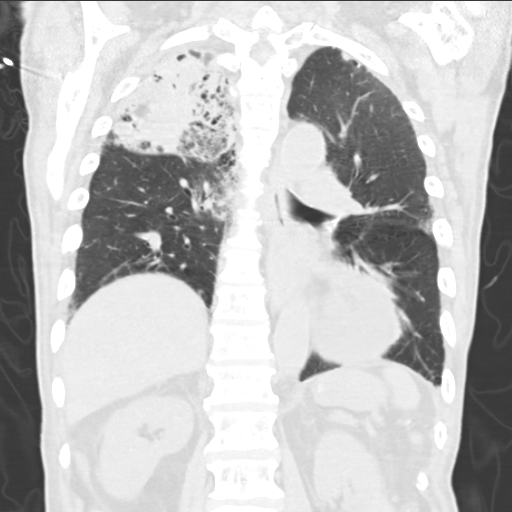

[15 of 36 positions shown; findings below may reference images not displayed]

FINDINGS: No pneumothorax or significant pleural effusion is noted. 8 mm
nodule is noted in lingula best seen on image number 34 series 3.
Large airspace opacity is noted posteriorly in the right upper lobe
most consistent with pneumonia. There appears to be some degree of
necrosis present within this suggesting developing abscess. 21 x 17
mm rounded density is seen inferior to this which may represent
focal inflammation, but is concerning for possible neoplasm giving
its well-defined appearance. Mild airspace opacity is seen medially
in the right lower lobe consistent with focal pneumonia.

No significant osseous abnormality is noted. Moderate pericardial
effusion is noted. Atherosclerotic calcifications of thoracic aorta
are noted without aneurysm formation. 31 x 22 mm left adrenal mass
is noted.
IMPRESSION: Large right upper lobe pneumonia is noted, with air present within
it suggesting parenchymal necrosis and possible developing abscess.
Smaller airspace opacity is seen medially in the right lower lobe
also concerning for pneumonia.

21 x 17 mm rounded density seen more inferiorly in the right upper
lobe which is concerning for possible neoplasm or malignancy.
Followup CT is recommended in 2-3 weeks to see if this represents
inflammation; if it is persistent, further evaluation with PET scan
or biopsy is recommended.

3.1 cm left adrenal mass is noted. Further evaluation with MRI is
recommended to evaluate for possible neoplasm.

Moderate pericardial effusion is noted.

8 mm left lingular nodule is noted. If the abnormality in the right
upper lobe is determined to be malignant, this would be consistent
with metastatic disease. Otherwise, if the patient is at high risk
for bronchogenic carcinoma, follow-up chest CT at 3-1months is
recommended. If the patient is at low risk for bronchogenic
carcinoma, follow-up chest CT at 6-12 months is recommended. This
recommendation follows the consensus statement: Guidelines for
Management of Small Pulmonary Nodules Detected on CT Scans: A
Statement from the [HOSPITAL] as published in Radiology
